# Patient Record
Sex: Male | Born: 1985 | Race: White | Hispanic: No | Marital: Single | Smoking: Never smoker
Health system: Southern US, Community
[De-identification: ages and names within clinical notes are randomized; demographics above are authoritative.]

## PROBLEM LIST (undated history)

## (undated) DIAGNOSIS — G8929 Other chronic pain: Secondary | ICD-10-CM

## (undated) DIAGNOSIS — G43909 Migraine, unspecified, not intractable, without status migrainosus: Secondary | ICD-10-CM

## (undated) DIAGNOSIS — M549 Dorsalgia, unspecified: Secondary | ICD-10-CM

## (undated) DIAGNOSIS — I10 Essential (primary) hypertension: Secondary | ICD-10-CM

## (undated) DIAGNOSIS — K259 Gastric ulcer, unspecified as acute or chronic, without hemorrhage or perforation: Secondary | ICD-10-CM

## (undated) DIAGNOSIS — Z9289 Personal history of other medical treatment: Secondary | ICD-10-CM

## (undated) HISTORY — PX: CHOLECYSTECTOMY: SHX55

## (undated) HISTORY — PX: APPENDECTOMY: SHX54

## (undated) HISTORY — PX: COLONOSCOPY: SHX174

---

## 2017-05-01 ENCOUNTER — Inpatient Hospital Stay (HOSPITAL_COMMUNITY)
Admission: EM | Admit: 2017-05-01 | Discharge: 2017-05-03 | DRG: 379 | Disposition: A | Discharge status: Home / Self Care | Payer: Self-pay | Attending: Internal Medicine | Admitting: Internal Medicine

## 2017-05-01 ENCOUNTER — Encounter (HOSPITAL_COMMUNITY): Payer: Self-pay | Admitting: Emergency Medicine

## 2017-05-01 ENCOUNTER — Emergency Department (HOSPITAL_COMMUNITY): Payer: Self-pay

## 2017-05-01 DIAGNOSIS — R778 Other specified abnormalities of plasma proteins: Secondary | ICD-10-CM | POA: Diagnosis present

## 2017-05-01 DIAGNOSIS — D509 Iron deficiency anemia, unspecified: Secondary | ICD-10-CM

## 2017-05-01 DIAGNOSIS — R739 Hyperglycemia, unspecified: Secondary | ICD-10-CM | POA: Diagnosis present

## 2017-05-01 DIAGNOSIS — F17290 Nicotine dependence, other tobacco product, uncomplicated: Secondary | ICD-10-CM | POA: Diagnosis present

## 2017-05-01 DIAGNOSIS — M549 Dorsalgia, unspecified: Secondary | ICD-10-CM | POA: Diagnosis present

## 2017-05-01 DIAGNOSIS — I10 Essential (primary) hypertension: Secondary | ICD-10-CM | POA: Diagnosis present

## 2017-05-01 DIAGNOSIS — D500 Iron deficiency anemia secondary to blood loss (chronic): Secondary | ICD-10-CM | POA: Diagnosis present

## 2017-05-01 DIAGNOSIS — K254 Chronic or unspecified gastric ulcer with hemorrhage: Principal | ICD-10-CM | POA: Diagnosis present

## 2017-05-01 DIAGNOSIS — R0600 Dyspnea, unspecified: Secondary | ICD-10-CM

## 2017-05-01 DIAGNOSIS — R7989 Other specified abnormal findings of blood chemistry: Secondary | ICD-10-CM

## 2017-05-01 DIAGNOSIS — G8929 Other chronic pain: Secondary | ICD-10-CM | POA: Diagnosis present

## 2017-05-01 DIAGNOSIS — K922 Gastrointestinal hemorrhage, unspecified: Secondary | ICD-10-CM | POA: Diagnosis present

## 2017-05-01 DIAGNOSIS — Z9049 Acquired absence of other specified parts of digestive tract: Secondary | ICD-10-CM

## 2017-05-01 DIAGNOSIS — T39395A Adverse effect of other nonsteroidal anti-inflammatory drugs [NSAID], initial encounter: Secondary | ICD-10-CM | POA: Diagnosis present

## 2017-05-01 DIAGNOSIS — R748 Abnormal levels of other serum enzymes: Secondary | ICD-10-CM | POA: Diagnosis present

## 2017-05-01 DIAGNOSIS — D649 Anemia, unspecified: Secondary | ICD-10-CM | POA: Diagnosis present

## 2017-05-01 HISTORY — DX: Gastric ulcer, unspecified as acute or chronic, without hemorrhage or perforation: K25.9

## 2017-05-01 HISTORY — DX: Dorsalgia, unspecified: M54.9

## 2017-05-01 HISTORY — DX: Migraine, unspecified, not intractable, without status migrainosus: G43.909

## 2017-05-01 HISTORY — DX: Essential (primary) hypertension: I10

## 2017-05-01 HISTORY — DX: Other chronic pain: G89.29

## 2017-05-01 LAB — BASIC METABOLIC PANEL
ANION GAP: 6 (ref 5–15)
BUN: 23 mg/dL — ABNORMAL HIGH (ref 6–20)
CALCIUM: 9.1 mg/dL (ref 8.9–10.3)
CHLORIDE: 106 mmol/L (ref 101–111)
CO2: 24 mmol/L (ref 22–32)
Creatinine, Ser: 1.18 mg/dL (ref 0.61–1.24)
GFR calc non Af Amer: >60 mL/min (ref >60)
Glucose, Bld: 118 mg/dL — ABNORMAL HIGH (ref 65–99)
Potassium: 4 mmol/L (ref 3.5–5.1)
Sodium: 136 mmol/L (ref 135–145)

## 2017-05-01 LAB — IRON AND TIBC
IRON: 12 ug/dL — AB (ref 45–182)
SATURATION RATIOS: 3 % — AB (ref 17.9–39.5)
TIBC: 384 ug/dL (ref 250–450)
UIBC: 372 ug/dL

## 2017-05-01 LAB — CBC WITH DIFFERENTIAL/PLATELET
BASOS ABS: 0 10*3/uL (ref 0.0–0.1)
BASOS PCT: 0 %
Eosinophils Absolute: 0.1 10*3/uL (ref 0.0–0.7)
Eosinophils Relative: 1 %
HEMATOCRIT: 24.2 % — AB (ref 39.0–52.0)
HEMOGLOBIN: 7.9 g/dL — AB (ref 13.0–17.0)
Lymphocytes Relative: 20 %
Lymphs Abs: 1.5 10*3/uL (ref 0.7–4.0)
MCH: 24.5 pg — ABNORMAL LOW (ref 26.0–34.0)
MCHC: 32.6 g/dL (ref 30.0–36.0)
MCV: 74.9 fL — ABNORMAL LOW (ref 78.0–100.0)
Monocytes Absolute: 0.4 10*3/uL (ref 0.1–1.0)
Monocytes Relative: 5 %
NEUTROS PCT: 74 %
Neutro Abs: 5.6 10*3/uL (ref 1.7–7.7)
Platelets: 358 10*3/uL (ref 150–400)
RBC: 3.23 MIL/uL — ABNORMAL LOW (ref 4.22–5.81)
RDW: 16.2 % — ABNORMAL HIGH (ref 11.5–15.5)
WBC: 7.5 10*3/uL (ref 4.0–10.5)

## 2017-05-01 LAB — ABO/RH: ABO/RH(D): B NEG

## 2017-05-01 LAB — VITAMIN B12: VITAMIN B 12: 184 pg/mL (ref 180–914)

## 2017-05-01 LAB — LACTATE DEHYDROGENASE: LDH: 129 U/L (ref 98–192)

## 2017-05-01 LAB — RETICULOCYTES
RBC.: 3.25 MIL/uL — ABNORMAL LOW (ref 4.22–5.81)
RETIC COUNT ABSOLUTE: 100.8 10*3/uL (ref 19.0–186.0)
Retic Ct Pct: 3.1 % (ref 0.4–3.1)

## 2017-05-01 LAB — PREPARE RBC (CROSSMATCH)

## 2017-05-01 LAB — POC OCCULT BLOOD, ED: FECAL OCCULT BLD: POSITIVE — AB

## 2017-05-01 LAB — FERRITIN: Ferritin: 6 ng/mL — ABNORMAL LOW (ref 24–336)

## 2017-05-01 LAB — TROPONIN I: Troponin I: 0.03 ng/mL (ref <0.03)

## 2017-05-01 LAB — FOLATE: FOLATE: 17.4 ng/mL (ref >5.9)

## 2017-05-01 MED ORDER — ACETAMINOPHEN 325 MG PO TABS
650.0000 mg | ORAL_TABLET | Freq: Four times a day (QID) | ORAL | Status: DC | PRN
  Administered 2017-05-01 – 2017-05-02 (×2): 650 mg via ORAL
  Filled 2017-05-01 (×2): qty 2

## 2017-05-01 MED ORDER — ACETAMINOPHEN 650 MG RE SUPP: 650.0000 mg | Freq: Four times a day (QID) | RECTAL | Status: DC | PRN

## 2017-05-01 MED ORDER — SODIUM CHLORIDE 0.9 % IV SOLN
Freq: Once | INTRAVENOUS | Status: AC
  Administered 2017-05-01: 17:00:00 via INTRAVENOUS

## 2017-05-01 MED ORDER — ONDANSETRON HCL 4 MG PO TABS: 4.0000 mg | ORAL_TABLET | Freq: Four times a day (QID) | ORAL | Status: DC | PRN

## 2017-05-01 MED ORDER — PANTOPRAZOLE SODIUM 40 MG IV SOLR
40.0000 mg | Freq: Two times a day (BID) | INTRAVENOUS | Status: DC
  Administered 2017-05-01 – 2017-05-02 (×2): 40 mg via INTRAVENOUS
  Filled 2017-05-01 (×2): qty 40

## 2017-05-01 MED ORDER — PANTOPRAZOLE SODIUM 40 MG IV SOLR
80.0000 mg | Freq: Once | INTRAVENOUS | Status: AC
  Administered 2017-05-01: 16:00:00 80 mg via INTRAVENOUS
  Filled 2017-05-01: qty 80

## 2017-05-01 MED ORDER — SODIUM CHLORIDE 0.9 % IV SOLN
8.0000 mg/h | INTRAVENOUS | Status: DC
  Administered 2017-05-01: 8 mg/h via INTRAVENOUS
  Filled 2017-05-01 (×3): qty 80

## 2017-05-01 MED ORDER — SODIUM CHLORIDE 0.9 % IV SOLN
INTRAVENOUS | Status: DC
  Administered 2017-05-01 – 2017-05-02 (×3): via INTRAVENOUS

## 2017-05-01 MED ORDER — BOOST / RESOURCE BREEZE PO LIQD
1.0000 | Freq: Three times a day (TID) | ORAL | Status: DC
Start: 2017-05-01 — End: 2017-05-03
  Administered 2017-05-01 – 2017-05-03 (×3): 1 via ORAL

## 2017-05-01 MED ORDER — PANTOPRAZOLE SODIUM 40 MG IV SOLR: 40.0000 mg | Freq: Two times a day (BID) | INTRAVENOUS | Status: DC

## 2017-05-01 MED ORDER — ONDANSETRON HCL 4 MG/2ML IJ SOLN: 4.0000 mg | Freq: Four times a day (QID) | INTRAMUSCULAR | Status: DC | PRN

## 2017-05-01 NOTE — H&P (Signed)
History and Physical    Alfred Avila ZOX:[id number] DOB: July 19, 1986 DOA: 05/01/2017  PCP: Patient, No Pcp Per Consultants:  None Patient coming from: Home - lives with roommates; NOK: [place], [phone]  Chief Complaint: SOB  HPI: Alfred Avila is a 31 y.o. male with medical history significant of GI bleed in 2017 requiring transfusion; migraines; and HTN presenting with SOB.  "Honestly, I thought it was a breathing problem.  Every time I stand up, I would get out of breath" x 2 days.  Started feeling tired at work 2 days ago and it has progressed.  No cough.  He tried to take Mucinex, take hot showers, did a breathing treatment (albuterol) - none of those things helped.  His friend commented that he looked pale and insisted he come to the hospital. No dark stool.  No indigestion.  No hematemesis.  No abdominal pain.  The last 3 weeks, he has taken  "a pretty massive amount" of NSAIDs - 8 Ibuprofen a day and 1 Goody's.  He reports that he pulled out his back at work and he was "eating these things" to get through the spasms and be able to work.  The back pain has finally started clearing up.  Denies ETOH - last use a little over a year now, none since his prior "ulcers".  +marijuana - last use 5 days ago, uses "not very often."  He vapes cannabinoid (CBD) oil.  He reports that he required a colonoscopy that required cauterization of 4 intestinal lesions that were bleeding prior - but he refers to these as "ulcers".  This was during hospitalization in [place] - he was transfused 6(?) units PRBC.   ED Course: Heme positive.  Likely iron deficient anemia from prolonged GI blood loss.  Transfusion since patient is symptomatic.  Started on Protonix drip.  Consider GI consult.  Review of Systems: As per HPI; otherwise review of systems reviewed and negative.   Ambulatory Status:  Ambulates without a cane or walker  Past Medical History:  Diagnosis Date  . Chronic back pain   .  Essential hypertension   . Gastric ulcer   . Migraines     Past Surgical History:  Procedure Laterality Date  . APPENDECTOMY    . CHOLECYSTECTOMY      Social History   Social History  . Marital status: Single    Spouse name: N/A  . Number of children: N/A  . Years of education: N/A   Occupational History  . Not on file.   Social History Main Topics  . Smoking status: Current Every Day Smoker    Types: E-cigarettes  . Smokeless tobacco: Never Used     Comment: cannabinoid oil (CBD oil) - 3-4 times daily  . Alcohol use Yes     Comment: denies use since prior "ulcer"  . Drug use: Yes    Types: Marijuana     Comment: marijuana 5 days ago  . Sexual activity: Not on file   Other Topics Concern  . 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    Not on File  History reviewed. No pertinent family history.  Prior to Admission medications   Not on File    Physical Exam: Vitals:   05/01/17 1456 05/01/17 1513 05/01/17 1619 05/01/17 1619  BP:  (!) 156/94 (!) 143/75  Pulse: 67 69  67  Resp: 16 18  17   Temp:    98.1 F (36.7 C)  TempSrc:    Oral  SpO2: 100% 94%  100%  Weight:      Height:         General: Appears calm and comfortable and is NAD Eyes:  PERRL, EOMI, normal lids, iris; pale conjunctivae ENT:  grossly normal hearing, lips & tongue, mmm Neck:  no LAD, masses or thyromegaly Cardiovascular:  RRR, no m/r/g. No LE edema.  Respiratory:  CTA bilaterally, no w/r/r. Normal respiratory effort. Abdomen:  soft, ntnd, NABS Skin:  no rash or induration seen on limited exam Musculoskeletal:  grossly normal tone BUE/BLE, good ROM, no bony abnormality Psychiatric:  grossly normal mood and affect, speech fluent and appropriate, AOx3 Neurologic:  CN 2-12 grossly intact, moves all extremities in coordinated fashion, sensation intact  Labs on Admission:  I have personally reviewed following labs and imaging studies  CBC:  Recent Labs Lab 05/01/17 1343  WBC 7.5  NEUTROABS 5.6  HGB 7.9*  HCT 24.2*  MCV 74.9*  PLT 358   Basic Metabolic Panel:  Recent Labs Lab 05/01/17 1343  NA 136  K 4.0  CL 106  CO2 24  GLUCOSE 118*  BUN 23*  CREATININE 1.18  CALCIUM 9.1   GFR: Estimated Creatinine Clearance: 118.3 mL/min (by C-G formula based on SCr of 1.18 mg/dL). Liver Function Tests: No results for input(s): AST, ALT, ALKPHOS, BILITOT, PROT, ALBUMIN in the last 168 hours. No results for input(s): LIPASE, AMYLASE in the last 168 hours. No results for input(s): AMMONIA in the last 168 hours. Coagulation Profile: No results for input(s): INR, PROTIME in the last 168 hours. Cardiac Enzymes:  Recent Labs Lab 05/01/17 1401  TROPONINI 0.03*   BNP (last 3 results) No results for input(s): PROBNP in the last 8760 hours. HbA1C: No results for input(s): HGBA1C in the last 72 hours. CBG: No results for input(s): GLUCAP in the last 168 hours. Lipid Profile: No results for input(s): CHOL, HDL, LDLCALC, TRIG, CHOLHDL, LDLDIRECT in the last 72 hours. Thyroid Function Tests: No results for input(s): TSH, T4TOTAL, FREET4, T3FREE, THYROIDAB in the last 72 hours. Anemia Panel:  Recent Labs  05/01/17 1401  RETICCTPCT 3.1   Urine analysis: No results found for: COLORURINE, APPEARANCEUR, LABSPEC, PHURINE, GLUCOSEU, HGBUR, BILIRUBINUR, KETONESUR, PROTEINUR, UROBILINOGEN, NITRITE, LEUKOCYTESUR  Creatinine Clearance: Estimated Creatinine Clearance: 118.3 mL/min (by C-G formula based on SCr of 1.18 mg/dL).  Sepsis Labs: @LABRCNTIP (procalcitonin:4,lacticidven:4) )No results found for this or any previous visit (from the past 240 hour(s)).   Radiological Exams on Admission: Dg Chest 2 View  Result Date: 05/01/2017 CLINICAL DATA:  Short of breath EXAM: CHEST  2 VIEW COMPARISON:  None. FINDINGS: The heart size and mediastinal contours are  within normal limits. Both lungs are clear. The visualized skeletal structures are unremarkable. IMPRESSION: No active cardiopulmonary disease. Electronically Signed   By: Marlan Palauharles  Clark M.D.   On: 05/01/2017 14:27    EKG: Independently reviewed.  NSR with rate 55; LVH with no evidence of acute ischemia  Assessment/Plan Principal Problem:   Symptomatic anemia Active Problems:   Upper GI bleed   Elevated troponin   Hyperglycemia   Essential hypertension   Symptomatic anemia -Hgb 7.9, MCV 74.9 - no prior labs available -MCV is <80 and so this is microcytic anemia -Since he is heme positive, GI bleeding is presumed to be the source of his anemia (see below) -Will observe in  med surg bed. -Given his age, apparent mild bleed (he has not noticed active bleeding and so this is more likely a slow leak than a particularly active bleed), and relatively stable current anemia, one could argue to hold a transfusion and follow the CBC for now. -However, transfusion of 2 units PRBC was initiated in the ER; will continue and recheck Hgb afterwards.  -Unless his bleeding becomes more brisk, he is not anticipated to need additional blood during this hospitalization. -If his SOB does not resolve after transfusion, that will require additional investigation.  Upper GI bleed -Patient with prior reported admission for GI bleed; the story on that bleed is contradictory in that he describes midepigastric abdominal pain and "ulcers" as the cause and states that he hd profuse hematochezia and needed 6 or 9 units PRBC but then reports no EGD and only a colonoscopy where he needed cautery of multiple lesions -Will request records from [place], since no additional information is available at this time. -In this circumstance, he admits to heavy recent NSAID use following an injury (now improved) but denies abdominal pain, burning, etc. -Stool per the EDP was soft and brown but very heme positive. -His BUN is mildly  elevated (23), possibly giving credence to an upper GI bleed -Will consult GI for possible EGD in AM. - Clear liquids today, NPO after MN for possible EGD - NS at 125 mL/hr - Will d/c Protonix drip (started in ER) and transition to IV Protonix BID since he is not actually having hematemesis - Zofran IV for nausea - Avoid NSAIDs and SQ heparin - Maintain IV access (2 large bore IVs if possible).  Elevated troponin -Troponin 0.03 -Likely due to very mild strain in the setting of anemia -Will trend -Very unlikely ACS  Hyperglycemia -Glucose 118 -May be stress response -Will follow with fasting AM labs for now  HTN -Reports taking metoprolol but he does not have any home medications listed -Consider restarting since patient appears to need HTN medication   DVT prophylaxis: Early ambulation Code Status:  Full - confirmed with patient Family Communication: None present Disposition Plan:  Home once clinically improved Consults called: GI  Admission status:  It is my clinical opinion that referral for OBSERVATION is reasonable and necessary in this patient based on the above information provided. The aforementioned taken together are felt to place the patient at high risk for further clinical deterioration. However it is anticipated that the patient may be medically stable for discharge from the hospital within 24 to 48 hours.     Jonah BlueJennifer Rashi Granier MD [facility]  If 7PM-7AM, please contact night-coverage [email or web address] Password [email or web address]  05/01/2017, 4:37 PM

## 2017-05-01 NOTE — ED Triage Notes (Signed)
Pt c/o sob with activity,chronic back pain. Pt color mildy pale/gray.  Denies blood in urine/stool. denis n/v/d. No cough.

## 2017-05-01 NOTE — ED Provider Notes (Signed)
[facility]-EMERGENCY DEPT Provider Note   CSN: [id number] Arrival date & time: 05/01/17  1321     History   Chief Complaint Chief Complaint  Patient presents with  . Shortness of Breath    HPI Alfred Avila is a 31 y.o. male.  HPI Patient is a 31 year old male who presents to emergency department increasing exertional shortness of breath over the past 2-3 days.  Family is noticed that these been pale.  He did require blood transfusion in 2017 for GI blood loss at that time.  He reports no melena or hematochezia.  Denies nausea vomiting.  Denies abdominal pain.  No chest pain or shortness breath at rest.  Denies orthopnea.  No prior history of congestive heart failure..  Denies cough.  No fevers or chills.  No use of anticoagulants   Past Medical History:  Diagnosis Date  . Chronic back pain   . Gastric ulcer     There are no active problems to display for this patient.   Past Surgical History:  Procedure Laterality Date  . APPENDECTOMY    . CHOLECYSTECTOMY         Home Medications    Prior to Admission medications   Not on File    Family History History reviewed. No pertinent family history.  Social History Social History  Substance Use Topics  . Smoking status: Never Smoker  . Smokeless tobacco: Never Used  . Alcohol use Yes     Comment: occ     Allergies   Patient has no allergy information on record.   Review of Systems Review of Systems  All other systems reviewed and are negative.    Physical Exam Updated Vital Signs BP (!) 156/94 (BP Location: Left Arm)   Pulse 69   Temp 98.2 F (36.8 C) (Oral)   Resp 18   Ht 6\' 6"  (1.981 m)   Wt 99.8 kg (220 lb)   SpO2 94%   BMI 25.42 kg/m   Physical Exam  Constitutional: He is oriented to person, place, and time. He appears well-developed and well-nourished.  HENT:  Head: Normocephalic and atraumatic.  Eyes: EOM are normal.  Pale conjunctivaPale conjunctiva  Neck: Normal range of motion.    Cardiovascular: Normal rate, regular rhythm and normal heart sounds.   Pulmonary/Chest: Effort normal and breath sounds normal. No respiratory distress.  Abdominal: Soft. He exhibits no distension. There is no tenderness.  Genitourinary:  Genitourinary Comments: Brown stool. No gross blood. hemoccult +  Musculoskeletal: Normal range of motion.  Neurological: He is alert and oriented to person, place, and time.  Skin: Skin is warm and dry.  Psychiatric: He has a normal mood and affect. Judgment normal.  Nursing note and vitals reviewed.    ED Treatments / Results  Labs (all labs ordered are listed, but only abnormal results are displayed) Labs Reviewed  CBC WITH DIFFERENTIAL/PLATELET - Abnormal; Notable for the following:       Result Value   RBC 3.23 (*)    Hemoglobin 7.9 (*)    HCT 24.2 (*)    MCV 74.9 (*)    MCH 24.5 (*)    RDW 16.2 (*)    All other components within normal limits  BASIC METABOLIC PANEL - Abnormal; Notable for the following:    Glucose, Bld 118 (*)    BUN 23 (*)    All other components within normal limits  RETICULOCYTES - Abnormal; Notable for the following:    RBC. 3.25 (*)  All other components within normal limits  TROPONIN I - Abnormal; Notable for the following:    Troponin I 0.03 (*)    All other components within normal limits  POC OCCULT BLOOD, ED - Abnormal; Notable for the following:    Fecal Occult Bld POSITIVE (*)    All other components within normal limits  LACTATE DEHYDROGENASE  VITAMIN B12  FOLATE  IRON AND TIBC  FERRITIN  TYPE AND SCREEN  PREPARE RBC (CROSSMATCH)    EKG  EKG Interpretation  Date/Time:  Tuesday May 01 2017 13:41:29 EDT Ventricular Rate:  55 PR Interval:    QRS Duration: 117 QT Interval:  476 QTC Calculation: 456 R Axis:   63 Text Interpretation:  Sinus rhythm LVH with IVCD and secondary repol abnrm Anterior ST elevation, probably due to LVH No old tracing to compare Confirmed by Azalia Bilisampos, Icker Swigert ([id number])  on 05/01/2017 3:44:30 PM       Radiology Dg Chest 2 View  Result Date: 05/01/2017 CLINICAL DATA:  Short of breath EXAM: CHEST  2 VIEW COMPARISON:  None. FINDINGS: The heart size and mediastinal contours are within normal limits. Both lungs are clear. The visualized skeletal structures are unremarkable. IMPRESSION: No active cardiopulmonary disease. Electronically Signed   By: Marlan Palauharles  Clark M.D.   On: 05/01/2017 14:27    Procedures .Critical Care Performed by: Azalia BilisAMPOS, Aquarius Tremper Authorized by: Azalia BilisAMPOS, Jenina Moening     Total critical care time: 35 minutes Critical care time was exclusive of separately billable procedures and treating other patients. Critical care was necessary to treat or prevent imminent or life-threatening deterioration. Critical care was time spent personally by me on the following activities: development of treatment plan with patient and/or surrogate as well as nursing, discussions with consultants, evaluation of patient's response to treatment, examination of patient, obtaining history from patient or surrogate, ordering and performing treatments and interventions, ordering and review of laboratory studies, ordering and review of radiographic studies, pulse oximetry and re-evaluation of patient's condition.   Medications Ordered in ED Medications  pantoprazole (PROTONIX) 80 mg in sodium chloride 0.9 % 100 mL IVPB (not administered)  pantoprazole (PROTONIX) 80 mg in sodium chloride 0.9 % 250 mL (0.32 mg/mL) infusion (not administered)  pantoprazole (PROTONIX) injection 40 mg (not administered)  0.9 %  sodium chloride infusion (not administered)     Initial Impression / Assessment and Plan / ED Course  I have reviewed the triage vital signs and the nursing notes.  Pertinent labs & imaging results that were available during my care of the patient were reviewed by me and considered in my medical decision making (see chart for details).     Hemoccult positive.  Likely iron  deficient anemia given microcytic anemia and likely prolonged GI blood loss.  Blood transfusion now as he is symptomatic.  Patient be admitted the hospital.  Initiated on IV Protonix with Protonix drip.  May benefit from endoscopy while in the hospital  Final Clinical Impressions(s) / ED Diagnoses   Final diagnoses:  Microcytic anemia  Dyspnea, unspecified type    New Prescriptions New Prescriptions   No medications on file     Azalia Bilisampos, Shakedra Beam, MD 05/01/17 1545

## 2017-05-02 ENCOUNTER — Encounter (HOSPITAL_COMMUNITY)
Admission: EM | Disposition: A | Discharge status: Home / Self Care | Payer: Self-pay | Source: Home / Self Care | Attending: Internal Medicine

## 2017-05-02 ENCOUNTER — Encounter (HOSPITAL_COMMUNITY): Payer: Self-pay | Admitting: Anesthesiology

## 2017-05-02 ENCOUNTER — Encounter (HOSPITAL_COMMUNITY): Payer: Self-pay | Admitting: *Deleted

## 2017-05-02 DIAGNOSIS — R195 Other fecal abnormalities: Secondary | ICD-10-CM

## 2017-05-02 DIAGNOSIS — D500 Iron deficiency anemia secondary to blood loss (chronic): Secondary | ICD-10-CM

## 2017-05-02 DIAGNOSIS — K3189 Other diseases of stomach and duodenum: Secondary | ICD-10-CM

## 2017-05-02 DIAGNOSIS — K259 Gastric ulcer, unspecified as acute or chronic, without hemorrhage or perforation: Secondary | ICD-10-CM

## 2017-05-02 HISTORY — PX: ESOPHAGOGASTRODUODENOSCOPY: SHX5428

## 2017-05-02 LAB — TYPE AND SCREEN
ABO/RH(D): B NEG
Antibody Screen: NEGATIVE
UNIT DIVISION: 0
Unit division: 0

## 2017-05-02 LAB — BPAM RBC
Blood Product Expiration Date: 201808202359
Blood Product Expiration Date: 201808302359
ISSUE DATE / TIME: 201807311611
ISSUE DATE / TIME: 201807311808
UNIT TYPE AND RH: 1700
Unit Type and Rh: 1700

## 2017-05-02 LAB — BASIC METABOLIC PANEL
ANION GAP: 5 (ref 5–15)
BUN: 16 mg/dL (ref 6–20)
CALCIUM: 8.6 mg/dL — AB (ref 8.9–10.3)
CO2: 25 mmol/L (ref 22–32)
Chloride: 109 mmol/L (ref 101–111)
Creatinine, Ser: 1.12 mg/dL (ref 0.61–1.24)
GLUCOSE: 97 mg/dL (ref 65–99)
Potassium: 4.1 mmol/L (ref 3.5–5.1)
SODIUM: 139 mmol/L (ref 135–145)

## 2017-05-02 LAB — HEMOGLOBIN AND HEMATOCRIT, BLOOD
HCT: 27.1 % — ABNORMAL LOW (ref 39.0–52.0)
HEMOGLOBIN: 8.8 g/dL — AB (ref 13.0–17.0)

## 2017-05-02 LAB — RAPID URINE DRUG SCREEN, HOSP PERFORMED
Amphetamines: POSITIVE — AB
Barbiturates: NOT DETECTED
Benzodiazepines: NOT DETECTED
COCAINE: NOT DETECTED
OPIATES: NOT DETECTED
Tetrahydrocannabinol: NOT DETECTED

## 2017-05-02 SURGERY — ESOPHAGOGASTRODUODENOSCOPY (EGD) WITH PROPOFOL
Anesthesia: Monitor Anesthesia Care

## 2017-05-02 SURGERY — EGD (ESOPHAGOGASTRODUODENOSCOPY)
Anesthesia: Moderate Sedation

## 2017-05-02 MED ORDER — LIDOCAINE VISCOUS 2 % MT SOLN
OROMUCOSAL | Status: AC
  Filled 2017-05-02: qty 15

## 2017-05-02 MED ORDER — PROPOFOL 10 MG/ML IV BOLUS
INTRAVENOUS | Status: AC
  Filled 2017-05-02: qty 40

## 2017-05-02 MED ORDER — LIDOCAINE HCL (PF) 1 % IJ SOLN
INTRAMUSCULAR | Status: AC
  Filled 2017-05-02: qty 5

## 2017-05-02 MED ORDER — MEPERIDINE HCL 50 MG/ML IJ SOLN
INTRAMUSCULAR | Status: AC
  Filled 2017-05-02: qty 1

## 2017-05-02 MED ORDER — MIDAZOLAM HCL 5 MG/5ML IJ SOLN
INTRAMUSCULAR | Status: DC | PRN
  Administered 2017-05-02 (×2): 2 mg via INTRAVENOUS

## 2017-05-02 MED ORDER — PROMETHAZINE HCL 25 MG/ML IJ SOLN
INTRAMUSCULAR | Status: AC
  Administered 2017-05-02: 25 mg
  Filled 2017-05-02: qty 1

## 2017-05-02 MED ORDER — PANTOPRAZOLE SODIUM 40 MG PO TBEC
40.0000 mg | DELAYED_RELEASE_TABLET | Freq: Two times a day (BID) | ORAL | Status: DC
Start: 2017-05-02 — End: 2017-05-03
  Administered 2017-05-02 – 2017-05-03 (×2): 40 mg via ORAL
  Filled 2017-05-02 (×3): qty 1

## 2017-05-02 MED ORDER — MIDAZOLAM HCL 5 MG/5ML IJ SOLN
INTRAMUSCULAR | Status: AC
  Filled 2017-05-02: qty 10

## 2017-05-02 MED ORDER — LIDOCAINE VISCOUS 2 % MT SOLN
OROMUCOSAL | Status: DC | PRN
  Administered 2017-05-02: 1 via OROMUCOSAL

## 2017-05-02 MED ORDER — MEPERIDINE HCL 50 MG/ML IJ SOLN
INTRAMUSCULAR | Status: DC | PRN
  Administered 2017-05-02 (×2): 25 mg via INTRAVENOUS

## 2017-05-02 MED ORDER — FENTANYL CITRATE (PF) 100 MCG/2ML IJ SOLN
INTRAMUSCULAR | Status: AC
  Filled 2017-05-02: qty 2

## 2017-05-02 MED ORDER — SODIUM CHLORIDE 0.9% FLUSH
INTRAVENOUS | Status: AC
  Filled 2017-05-02: qty 10

## 2017-05-02 MED ORDER — SODIUM CHLORIDE 0.9 % IV SOLN
INTRAVENOUS | Status: DC
  Administered 2017-05-02: 1000 mL via INTRAVENOUS

## 2017-05-02 MED ORDER — MIDAZOLAM HCL 2 MG/2ML IJ SOLN
INTRAMUSCULAR | Status: AC
  Filled 2017-05-02: qty 2

## 2017-05-02 MED ORDER — FERROUS SULFATE 325 (65 FE) MG PO TABS
325.0000 mg | ORAL_TABLET | Freq: Two times a day (BID) | ORAL | Status: DC
  Administered 2017-05-02 – 2017-05-03 (×2): 325 mg via ORAL
  Filled 2017-05-02 (×3): qty 1

## 2017-05-02 NOTE — Op Note (Signed)
[facility] Patient Name: Alfred Avila Procedure Date: 05/02/2017 4:27 PM MRN: [id number] Date of Birth: 1986/08/14 Attending MD: Lionel December , MD CSN: [id number] Age: 31 Admit Type: Inpatient Procedure:                Upper GI endoscopy Indications:              Iron deficiency anemia secondary to chronic blood                            loss, Heme positive stool Providers:                Lionel December, MD, Jannett Celestine, RN, Nena Polio, RN Referring MD:             Houston Siren, MD Medicines:                Promethazine 25 mg IV, Lidocaine spray, Meperidine                            50 mg IV, Midazolam 5 mg IV Complications:            No immediate complications. Estimated Blood Loss:     Estimated blood loss: none. Procedure:                Pre-Anesthesia Assessment:                           - Prior to the procedure, a History and Physical                            was performed, and patient medications and                            allergies were reviewed. The patient's tolerance of                            previous anesthesia was also reviewed. The risks                            and benefits of the procedure and the sedation                            options and risks were discussed with the patient.                            All questions were answered, and informed consent                            was obtained. Prior Anticoagulants: The patient                            last took aspirin 3 days and ibuprofen 3 days prior                            to the procedure. ASA Grade Assessment: I - A  normal, healthy patient. After reviewing the risks                            and benefits, the patient was deemed in                            satisfactory condition to undergo the procedure.                           After obtaining informed consent, the endoscope was                            passed under direct vision. Throughout the                     procedure, the patient's blood pressure, pulse, and                            oxygen saturations were monitored continuously. The                            [id number] ([id number]) scope was introduced through the                            mouth, and advanced to the second part of duodenum.                            The upper GI endoscopy was accomplished without                            difficulty. The patient tolerated the procedure                            well. Scope In: 4:45:46 PM Scope Out: 4:52:58 PM Total Procedure Duration: 0 hours 7 minutes 12 seconds  Findings:      The examined esophagus was normal.      The Z-line was regular and was found 44 cm from the incisors.      A small amount of food (residue) was found in the gastric body.      A few erosions were found in the gastric antrum. There were no stigmata       of recent bleeding.      Two non-bleeding cratered and superficial gastric ulcers with no       stigmata of bleeding were found in the prepyloric region of the stomach.       The largest lesion was 3 mm in largest dimension.      The exam of the stomach was otherwise normal.      The duodenal bulb and second portion of the duodenum were normal. Impression:               - Normal esophagus.                           - Z-line regular, 44 cm from the incisors.                           -  A small amount of food (residue) in the stomach.                           - Erosive gastropathy.                           - Non-bleeding gastric ulcers with no stigmata of                            bleeding.                           - Normal duodenal bulb and second portion of the                            duodenum.                           - No specimens collected.                           comment: No stigmata of bleeding noted from gastric                            ulcer. He could've bled from these lesions few days                            ago or he  could've bled from small or large                            bowel(NSAID-induced injury to small or large bowel).                           No further workup unless bleeding recurs or he does                            not respond to by mouth iron. Moderate Sedation:      Moderate (conscious) sedation was administered by the endoscopy nurse       and supervised by the endoscopist. The following parameters were       monitored: oxygen saturation, heart rate, blood pressure, CO2       capnography and response to care. Total physician intraservice time was       14 minutes. Recommendation:           - Return patient to hospital ward for ongoing care.                           - Low sodium diet today.                           - Continue present medications.                           - H. pylori serology.                           -  Change PPI to oral route.                           - Begin ferrous sulfate 325 mg by mouth twice a day.                           - No NSAIDs. Procedure Code(s):        --- Professional ---                           [phone], Esophagogastroduodenoscopy, flexible,                            transoral; diagnostic, including collection of                            specimen(s) by brushing or washing, when performed                            (separate procedure)                           99152, Moderate sedation services provided by the                            same physician or other qualified health care                            professional performing the diagnostic or                            therapeutic service that the sedation supports,                            requiring the presence of an independent trained                            observer to assist in the monitoring of the                            patient's level of consciousness and physiological                            status; initial 15 minutes of intraservice time,                             patient age 90 years or older Diagnosis Code(s):        --- Professional ---                           K31.89, Other diseases of stomach and duodenum                           K25.9, Gastric ulcer, unspecified as acute or  chronic, without hemorrhage or perforation                           D50.0, Iron deficiency anemia secondary to blood                            loss (chronic)                           R19.5, Other fecal abnormalities CPT copyright 2016 [employer]. All rights reserved. The codes documented in this report are preliminary and upon coder review may  be revised to meet current compliance requirements. Lionel DecemberNajeeb Rehman, MD Lionel DecemberNajeeb Rehman, MD 05/02/2017 5:13:35 PM This report has been signed electronically. Number of Addenda: 0

## 2017-05-02 NOTE — Consult Note (Signed)
Reason for Consult:GI bleed Referring Physician:   [name] is an 31 y.o. male.  HPI: Admitted thru the ED yesterday. States he felt weak. Hx of ulcer per patient. Admitted to [facility] in 2017. States he had a colonoscopy and he says he had 4 ulcers. He denies any abdominal pain or heart burn at this time. His stools are brown in color. States he has been taking 8 Ibuprofen a day and Goody Powder's x 1 a day.  No Goody Powders or Ibuprofen  in 3 days for back pain. No weight loss. Appetite is okay. BMs x 1 a day. No melena.  On admission his hemoglobin was 7.9. Since admission he has received 2 units of PRBCs. Stool was brown and guaiac positive in the ED.  No PCP.   Past Medical History:  Diagnosis Date  . Chronic back pain   . Essential hypertension   . Gastric ulcer   . Migraines     Past Surgical History:  Procedure Laterality Date  . APPENDECTOMY    . CHOLECYSTECTOMY      History reviewed. No pertinent family history.  Social History:  reports that he has been smoking E-cigarettes.  He has never used smokeless tobacco. He reports that he drinks alcohol. He reports that he uses drugs, including Marijuana.  Allergies: No Known Allergies  Medications: I have reviewed the patient's current medications.  Results for orders placed or performed during the hospital encounter of 05/01/17 (from the past 48 hour(s))  CBC with Differential     Status: Abnormal   Collection Time: 05/01/17  1:43 PM  Result Value Ref Range   WBC 7.5 4.0 - 10.5 K/uL   RBC 3.23 (L) 4.22 - 5.81 MIL/uL   Hemoglobin 7.9 (L) 13.0 - 17.0 g/dL   HCT 24.2 (L) 39.0 - 52.0 %   MCV 74.9 (L) 78.0 - 100.0 fL   MCH 24.5 (L) 26.0 - 34.0 pg   MCHC 32.6 30.0 - 36.0 g/dL   RDW 16.2 (H) 11.5 - 15.5 %   Platelets 358 150 - 400 K/uL   Neutrophils Relative % 74 %   Neutro Abs 5.6 1.7 - 7.7 K/uL   Lymphocytes Relative 20 %   Lymphs Abs 1.5 0.7 - 4.0 K/uL   Monocytes Relative 5 %   Monocytes Absolute  0.4 0.1 - 1.0 K/uL   Eosinophils Relative 1 %   Eosinophils Absolute 0.1 0.0 - 0.7 K/uL   Basophils Relative 0 %   Basophils Absolute 0.0 0.0 - 0.1 K/uL  Basic metabolic panel     Status: Abnormal   Collection Time: 05/01/17  1:43 PM  Result Value Ref Range   Sodium 136 135 - 145 mmol/L   Potassium 4.0 3.5 - 5.1 mmol/L   Chloride 106 101 - 111 mmol/L   CO2 24 22 - 32 mmol/L   Glucose, Bld 118 (H) 65 - 99 mg/dL   BUN 23 (H) 6 - 20 mg/dL   Creatinine, Ser 1.18 0.61 - 1.24 mg/dL   Calcium 9.1 8.9 - 10.3 mg/dL   GFR calc non Af Amer >60 >60 mL/min   GFR calc Af Amer >60 >60 mL/min    Comment: (NOTE) The eGFR has been calculated using the CKD EPI equation. This calculation has not been validated in all clinical situations. eGFR's persistently <60 mL/min signify possible Chronic Kidney Disease.    Anion gap 6 5 - 15  Type and screen [facility]     Status:  None   Collection Time: 05/01/17  1:43 PM  Result Value Ref Range   ABO/RH(D) B NEG    Antibody Screen NEG    Sample Expiration 05/04/2017    Unit Number [id number]    Blood Component Type RED CELLS,LR    Unit division 00    Status of Unit ISSUED,FINAL    Transfusion Status OK TO TRANSFUSE    Crossmatch Result Compatible    Unit Number [id number]    Blood Component Type RED CELLS,LR    Unit division 00    Status of Unit ISSUED,FINAL    Transfusion Status OK TO TRANSFUSE    Crossmatch Result Compatible   ABO/Rh     Status: None   Collection Time: 05/01/17  1:43 PM  Result Value Ref Range   ABO/RH(D) B NEG   Vitamin B12     Status: None   Collection Time: 05/01/17  2:01 PM  Result Value Ref Range   Vitamin B-12 184 180 - 914 pg/mL    Comment: (NOTE) This assay is not validated for testing neonatal or myeloproliferative syndrome specimens for Vitamin B12 levels. Performed at [facility], [place] [address], [place], [place] [address]   Iron and TIBC     Status: Abnormal   Collection Time: 05/01/17   2:01 PM  Result Value Ref Range   Iron 12 (L) 45 - 182 ug/dL   TIBC 384 250 - 450 ug/dL   Saturation Ratios 3 (L) 17.9 - 39.5 %   UIBC 372 ug/dL    Comment: Performed at [facility], [place] [address], [place], [place] [address]  Ferritin     Status: Abnormal   Collection Time: 05/01/17  2:01 PM  Result Value Ref Range   Ferritin 6 (L) 24 - 336 ng/mL    Comment: Performed at [facility], [place] [address] [place], [place] [address]  Reticulocytes     Status: Abnormal   Collection Time: 05/01/17  2:01 PM  Result Value Ref Range   Retic Ct Pct 3.1 0.4 - 3.1 %   RBC. 3.25 (L) 4.22 - 5.81 MIL/uL   Retic Count, Absolute 100.8 19.0 - 186.0 K/uL  Lactate dehydrogenase     Status: None   Collection Time: 05/01/17  2:01 PM  Result Value Ref Range   LDH 129 98 - 192 U/L  Troponin I     Status: Abnormal   Collection Time: 05/01/17  2:01 PM  Result Value Ref Range   Troponin I 0.03 (HH) <0.03 ng/mL    Comment: CRITICAL RESULT CALLED TO, READ BACK BY AND VERIFIED WITH: Monacelli,C ON 05/01/17 AT 1455 BY LOY,C   Folate     Status: None   Collection Time: 05/01/17  2:15 PM  Result Value Ref Range   Folate 17.4 >5.9 ng/mL    Comment: Performed at [facility], [place] [address] [place], [place] [address]  POC occult blood, ED     Status: Abnormal   Collection Time: 05/01/17  3:36 PM  Result Value Ref Range   Fecal Occult Bld POSITIVE (A) NEGATIVE  Prepare RBC     Status: None   Collection Time: 05/01/17  4:00 PM  Result Value Ref Range   Order Confirmation ORDER PROCESSED BY BLOOD BANK   Basic metabolic panel     Status: Abnormal   Collection Time: 05/02/17  6:25 AM  Result Value Ref Range   Sodium 139 135 - 145 mmol/L  Potassium 4.1 3.5 - 5.1 mmol/L   Chloride 109 101 - 111 mmol/L   CO2 25 22 - 32 mmol/L   Glucose, Bld 97 65 - 99 mg/dL   BUN 16 6 - 20 mg/dL   Creatinine, Ser 1.12 0.61 - 1.24 mg/dL   Calcium 8.6 (L) 8.9 - 10.3 mg/dL   GFR calc non Af Amer >60 >60  mL/min   GFR calc Af Amer >60 >60 mL/min    Comment: (NOTE) The eGFR has been calculated using the CKD EPI equation. This calculation has not been validated in all clinical situations. eGFR's persistently <60 mL/min signify possible Chronic Kidney Disease.    Anion gap 5 5 - 15    Dg Chest 2 View  Result Date: 05/01/2017 CLINICAL DATA:  Short of breath EXAM: CHEST  2 VIEW COMPARISON:  None. FINDINGS: The heart size and mediastinal contours are within normal limits. Both lungs are clear. The visualized skeletal structures are unremarkable. IMPRESSION: No active cardiopulmonary disease. Electronically Signed   By: Franchot Gallo M.D.   On: 05/01/2017 14:27    ROS Blood pressure (!) 161/100, pulse 75, temperature 97.8 F (36.6 C), temperature source Oral, resp. rate 18, height 6' 6"  (1.981 m), weight 220 lb (99.8 kg), SpO2 100 %. Physical Exam Alert and oriented. Skin warm and dry. Oral mucosa is moist.   . Sclera anicteric, conjunctivae is pink. Thyroid not enlarged. No cervical lymphadenopathy. Lungs clear. Heart regular rate and rhythm.  Abdomen is soft. Bowel sounds are positive. No hepatomegaly. No abdominal masses felt. No tenderness.  No edema to lower extremities.    Assessment/Plan: Symptomatic anemia. Probably GI bleed. Agree with Protonix. I will discuss with Dr. Laural Golden.  Will get records from [facility] (colonoscopy). Clear liquids this am and then NPO.   SETZER,TERRI W 05/02/2017, 7:47 AM

## 2017-05-02 NOTE — Progress Notes (Signed)
Initial Nutrition Assessment  DOCUMENTATION CODES:   Not applicable  INTERVENTION:  Boost Breeze po TID, each supplement provides 250 kcal and 9 grams of protein   Advance diet as pt is medically cleared by GI   NUTRITION DIAGNOSIS:   Altered nutrition lab value related to altered GI function as evidenced by  (Symtomatic anemia).   GOAL:   Patient will meet greater than or equal to 90% of their needs   MONITOR:   Diet advancement, Labs, Weight trends  REASON FOR ASSESSMENT:   Malnutrition Screening Tool    ASSESSMENT: Mr Randa Evensdwards has a hx of GIB, HTN, Back pain. He presents with c/o shortness of breath and was found to be anemic. Heme positive stools. EGD is pending.  During RD visit he is complaining of being hungry. His has been advanced to clear liquid (100%) consumed. His home diet is regular and pt says he has a garden which has done very well this year. A few years ago after gall bladder surgery- he began eating more fruits and vegetables and has decreased the amount of fried foods he eats. He bakes instead of frying and tries to eat 5-6 small meals daily. Lately he has been drinking Boost to supplement intake. He also takes a fish oil and MVI at home. Nutrition-Focused physical exam: well nourished.   His weight has decreased over the past few years per patient but expect this is directly associated with his diet changes and considered desirable.     Recent Labs Lab 05/01/17 1343 05/02/17 0625  NA 136 139  K 4.0 4.1  CL 106 109  CO2 24 25  BUN 23* 16  CREATININE 1.18 1.12  CALCIUM 9.1 8.6*  GLUCOSE 118* 97   labs and meds reviewed.  Diet Order:  Diet clear liquid Room service appropriate? Yes; Fluid consistency: Thin  Skin:  Reviewed, no issues  Last BM:  7/31  Height:   Ht Readings from Last 1 Encounters:  05/01/17 6\' 6"  (1.981 m)    Weight:   Wt Readings from Last 1 Encounters:  05/01/17 220 lb (99.8 kg)    Ideal Body Weight:  97 kg  BMI:   Body mass index is 25.42 kg/m.  Estimated Nutritional Needs:   Kcal:  2200-2300 kcal  Protein:  110 gr   Fluid:  2.2-2.3 liters daily  EDUCATION NEEDS: none identified at this time    Royann ShiversLynn Jeremy Mclamb MS,RD,CSG,LDN Office: #[phone] Pager: [phone]

## 2017-05-02 NOTE — Progress Notes (Signed)
PROGRESS NOTE    Alfred LeydenDustin Avila  WUJ:[id number] DOB: 08/19/1986 DOA: 05/01/2017 PCP: Patient, No Pcp Per    Brief Narrative: 31 yo admitted for UGI bleed, felt to be due to NSAIDS.  He had EGD which showed clean based ulcer x 2 in the stomach. He could be bleeding from small bowels.  Dr Renae Fickleheman recommended avoiding ASA and NSAIDS.  He will continue with PPI, and obtain another H and H in the morning before he can be discharged.    Assessment & Plan:   Principal Problem:   Symptomatic anemia Active Problems:   Upper GI bleed   Elevated troponin   Hyperglycemia   Essential hypertension   1. UGIB:  From NSAIDS use.  Will check another H and H in the morning.  If stable, Dr Renae Fickleheman of GI felt that he can be discharged tomorrow morning. 2.        HTN:  BB was held in the setting of severe bleeding causing anemia. 3.        Elevation of troponin:  Modest.  Clinically not significant.    DVT prophylaxis: SCD.  Code Status: FULL CODE.  Family Communication: None.  Disposition Plan: Home.   Consultants:   GI.   Procedures:   EGD  Antimicrobials: Anti-infectives    None       Subjective:  Wanted to go home.   Objective: Vitals:   05/02/17 1650 05/02/17 1655 05/02/17 1700 05/02/17 1705  BP: (!) 180/94 (!) 156/92 (!) 167/74 (!) 163/81  Pulse: 82 77 78 78  Resp: 14 13 11 11   Temp:      TempSrc:      SpO2: 100% 100% 100% 100%  Weight:      Height:        Intake/Output Summary (Last 24 hours) at 05/02/17 1711 Last data filed at 05/02/17 1630  Gross per 24 hour  Intake              570 ml  Output             1150 ml  Net             -580 ml   Filed Weights   05/01/17 1326  Weight: 99.8 kg (220 lb)    Examination:  General exam: Appears calm and comfortable  Respiratory system: Clear to auscultation. Respiratory effort normal. Cardiovascular system: S1 & S2 heard, RRR. No JVD, murmurs, rubs, gallops or clicks. No pedal edema. Gastrointestinal system: Abdomen  is nondistended, soft and nontender. No organomegaly or masses felt. Normal bowel sounds heard. Central nervous system: Alert and oriented. No focal neurological deficits. Extremities: Symmetric 5 x 5 power. Skin: No rashes, lesions or ulcers Psychiatry: Judgement and insight appear normal. Mood & affect appropriate.   Data Reviewed: I have personally reviewed following labs and imaging studies  CBC:  Recent Labs Lab 05/01/17 1343 05/02/17 0625  WBC 7.5  --   NEUTROABS 5.6  --   HGB 7.9* 8.8*  HCT 24.2* 27.1*  MCV 74.9*  --   PLT 358  --    Basic Metabolic Panel:  Recent Labs Lab 05/01/17 1343 05/02/17 0625  NA 136 139  K 4.0 4.1  CL 106 109  CO2 24 25  GLUCOSE 118* 97  BUN 23* 16  CREATININE 1.18 1.12  CALCIUM 9.1 8.6*    Recent Labs Lab 05/01/17 1401  TROPONINI 0.03*   Anemia Panel:  Recent Labs  05/01/17 1401 05/01/17 1415  VITAMINB12  184  --   FOLATE  --  17.4  FERRITIN 6*  --   TIBC 384  --   IRON 12*  --   RETICCTPCT 3.1  --    Radiology Studies: Dg Chest 2 View  Result Date: 05/01/2017 CLINICAL DATA:  Short of breath EXAM: CHEST  2 VIEW COMPARISON:  None. FINDINGS: The heart size and mediastinal contours are within normal limits. Both lungs are clear. The visualized skeletal structures are unremarkable. IMPRESSION: No active cardiopulmonary disease. Electronically Signed   By: Marlan Palauharles  Clark M.D.   On: 05/01/2017 14:27    Scheduled Meds: . lidocaine      . meperidine      . midazolam      . [MAR Hold] feeding supplement  1 Container Oral TID BM  . ferrous sulfate  325 mg Oral BID WC  . pantoprazole  40 mg Oral BID AC   Continuous Infusions: . sodium chloride 125 mL/hr at 05/02/17 0701  . sodium chloride 1,000 mL (05/02/17 1516)     LOS: 0 days   Clemens Lachman, MD FACP Hospitalist.   If 7PM-7AM, please contact night-coverage [email or web address] Password [email or web address] 05/02/2017, 5:11 PM

## 2017-05-03 ENCOUNTER — Encounter (HOSPITAL_COMMUNITY): Payer: Self-pay | Admitting: Internal Medicine

## 2017-05-03 DIAGNOSIS — I10 Essential (primary) hypertension: Secondary | ICD-10-CM

## 2017-05-03 DIAGNOSIS — D649 Anemia, unspecified: Secondary | ICD-10-CM

## 2017-05-03 DIAGNOSIS — R748 Abnormal levels of other serum enzymes: Secondary | ICD-10-CM

## 2017-05-03 DIAGNOSIS — R0600 Dyspnea, unspecified: Secondary | ICD-10-CM

## 2017-05-03 LAB — CBC
HCT: 30.1 % — ABNORMAL LOW (ref 39.0–52.0)
Hemoglobin: 9.6 g/dL — ABNORMAL LOW (ref 13.0–17.0)
MCH: 25.2 pg — ABNORMAL LOW (ref 26.0–34.0)
MCHC: 31.9 g/dL (ref 30.0–36.0)
MCV: 79 fL (ref 78.0–100.0)
PLATELETS: 344 10*3/uL (ref 150–400)
RBC: 3.81 MIL/uL — ABNORMAL LOW (ref 4.22–5.81)
RDW: 18.1 % — AB (ref 11.5–15.5)
WBC: 6.9 10*3/uL (ref 4.0–10.5)

## 2017-05-03 LAB — HIV ANTIBODY (ROUTINE TESTING W REFLEX): HIV Screen 4th Generation wRfx: NONREACTIVE

## 2017-05-03 MED ORDER — TEMAZEPAM 15 MG PO CAPS
15.0000 mg | ORAL_CAPSULE | Freq: Every evening | ORAL | Status: DC | PRN
  Administered 2017-05-03: 15 mg via ORAL
  Filled 2017-05-03: qty 1

## 2017-05-03 MED ORDER — PANTOPRAZOLE SODIUM 40 MG PO TBEC: 40.0000 mg | DELAYED_RELEASE_TABLET | Freq: Two times a day (BID) | ORAL | 1 refills | Status: DC

## 2017-05-03 MED ORDER — HYDRALAZINE HCL 20 MG/ML IJ SOLN
20.0000 mg | INTRAMUSCULAR | Status: DC | PRN
  Administered 2017-05-03: 20 mg via INTRAVENOUS
  Filled 2017-05-03: qty 1

## 2017-05-03 MED ORDER — FERROUS SULFATE 325 (65 FE) MG PO TABS: 325.0000 mg | ORAL_TABLET | Freq: Two times a day (BID) | ORAL | 1 refills | Status: DC

## 2017-05-03 NOTE — Treatment Plan (Signed)
The nursing staff reported that blood pressure 176/150 mmHg. He is otherwise in no acute distress. He has a history of hypertension and is on oral metoprolol at home. The patient's chart was reviewed. He had EGD done earlier in the day. Urine toxicology shows positivity to amphetamine, so beta blockers are contraindicated at this time. The patient's weight is about 100 kg. I have ordered hydralazine 20 mg IVP every 6 hours for SBP over 150 mmHg. Temazepam 15 mg by mouth daily at bedtime when necessary ordered for insomnia.  Sanda Kleinavid Ortiz, M.D.

## 2017-05-03 NOTE — Progress Notes (Signed)
Notified the MD of patients request for note to return to work.

## 2017-05-03 NOTE — Care Management Note (Signed)
Case Management Note  Patient Details  Name: Alfred Avila MRN: [id number] Date of Birth: 03/07/1986  Subjective/Objective:                  Pt admitted with anemia. He does not live alone, has family support. He has no insurance, is employed approx 6 months of the year. He has no PCP, [facility] but has trouble because he does not always meet the financial requirements. He says he is relatively healthy. He is on metoprolol and has inhaler at bedside. He says he uses online ordering to obtain those medications without RX. He can afford the medications being RX at DC. Has MD to follow up with. Communicates no needs.   Action/Plan: DC home today.   Expected Discharge Date:  05/03/17               Expected Discharge Plan:  Home/Self Care  In-House Referral:  NA  Discharge planning Services  CM Consult  Post Acute Care Choice:  NA Choice offered to:  NA  Status of Service:  Completed, signed off  Malcolm MetroChildress, Kristoff Coonradt Demske, RN 05/03/2017, 12:39 PM

## 2017-05-03 NOTE — Progress Notes (Signed)
Patient discharged with instructions, prescription, and care notes.  Verbalized understanding via teach back.  IV was removed and the site was WNL. Patient voiced no further complaints or concerns at the time of discharge.  Appointments scheduled per instructions.  Patient left the floor via w/c family  And staff in stable condition. 

## 2017-05-03 NOTE — Discharge Summary (Signed)
Physician Discharge Summary  Alfred Avila:[id number] DOB: 07/31/1986 DOA: 05/01/2017  PCP: Patient, No Pcp Per  Admit date: 05/01/2017 Discharge date: 05/03/2017  Admitted From: Home.  Disposition:  Home.   Recommendations for Outpatient Follow-up:  1. Follow up with PCP in 1-2 weeks 2. Follow up with Dr Renae Fickleheman in 4 weeks.   Home Health: None.  Equipment/Devices: None.  Discharge Condition: hb of10, no further bleeding.  CODE STATUS: FULL CODE.  Diet recommendation: Heathy diet.   Brief/Interim Summary: patient was admitted by Dr Ophelia CharterYates on May 01, 2017 for upper GI bleed with Hb of 7.9 g per dL.  As per her H and P:  " Alfred Avila.  "Honestly, I thought it was a breathing problem.  Every time I stand up, I would get out of breath" x 2 days.  Started feeling tired at work 2 days ago and it has progressed.  No cough.  He tried to take Mucinex, take hot showers, did a breathing treatment (albuterol) - none of those things helped.  His friend commented that he looked pale and insisted he come to the hospital. No dark stool.  No indigestion.  No hematemesis.  No abdominal pain.  The last 3 weeks, he has taken  "a pretty massive amount" of NSAIDs - 8 Ibuprofen a day and 1 Goody's.  He reports that he pulled out his back at work and he was "eating these things" to get through the spasms and be able to work.  The back pain has finally started clearing up.  Denies ETOH - last use a little over a year now, none since his prior "ulcers".  +marijuana - last use 5 days ago, uses "not very often."  He vapes cannabinoid (CBD) oil.  He reports that he required a colonoscopy that required cauterization of 4 intestinal lesions that were bleeding prior - but he refers to these as "ulcers".  This was during [place] in [place] - he was transfused 6(?) units PRBC.   ED  Course: Heme positive.  Likely iron deficient anemia from prolonged GI blood loss.  Avila since patient is symptomatic.  Started on Protonix drip.  Consider GI consult.  HOSPITAL COURSE:  Patient was admitted into the hospital, given IVF and clear liquid.  He was transfused 2 units of PRBCs, and GI was consulted.  Dr Renae Fickleheman saw him and performed EGD, which revealed 2 clean base ulcers.  He was able to cannulate to D2 and saw no further evidence of bleeding.  He also has hx of HTN, but his betablocker was held, in the setting of potential significant bleeding, and he will be able to resume his BB upon discharge.  He will follow up with Dr Renae Fickleheman in 4 weeks.  He will avoid NSAIDS and no ASA.  He was kept overnight after EGD, to assure his Hb remained stable, and it was.  Dr Renae Fickleheman also gave him iron to take.  He will be discharged on Protonix 40mg  BID until he sees Dr Renae Fickleheman.   Thank you for allowing me to participate in his care.  Good Day.   Discharge Diagnoses:  Principal Problem:   Symptomatic anemia Active Problems:   Upper GI bleed   Elevated troponin   Hyperglycemia   Essential hypertension    Discharge Instructions  Discharge Instructions    Diet - low sodium heart healthy  Complete by:  As directed    Discharge instructions    Complete by:  As directed    Do not take Aspirin or any of the Non steroidals Dr Conley RollsLe mentioned.  Follow up with Dr Renae Fickleheman is important.   Increase activity slowly    Complete by:  As directed      Allergies as of 05/03/2017   No Known Allergies     Medication List    TAKE these medications   ferrous sulfate 325 (65 FE) MG tablet Take 1 tablet (325 mg total) by mouth 2 (two) times daily with a meal.   metoprolol succinate 50 MG 24 hr tablet Commonly known as:  TOPROL-XL Take 50 mg by mouth daily. Take with or immediately following a meal.   pantoprazole 40 MG tablet Commonly known as:  PROTONIX Take 1 tablet (40 mg total) by mouth 2 (two)  times daily before a meal.       No Known Allergies  Consultations:  GI.    Procedures/Studies: Dg Chest 2 View  Result Date: 05/01/2017 CLINICAL DATA:  Short of breath EXAM: CHEST  2 VIEW COMPARISON:  None. FINDINGS: The heart size and mediastinal contours are within normal limits. Both lungs are clear. The visualized skeletal structures are unremarkable. IMPRESSION: No active cardiopulmonary disease. Electronically Signed   By: Marlan Palauharles  Clark M.D.   On: 05/01/2017 14:27       Subjective:  No further bleeding.    Discharge Exam: Vitals:   05/03/17 0218 05/03/17 0543  BP: (!) 176/115 (!) 160/97  Pulse:  82  Resp:  16  Temp:  98 F (36.7 C)   Vitals:   05/02/17 1800 05/02/17 2100 05/03/17 0218 05/03/17 0543  BP: (!) 183/106 (!) 190/110 (!) 176/115 (!) 160/97  Pulse:  (!) 55  82  Resp:  13  16  Temp:    98 F (36.7 C)  TempSrc:    Oral  SpO2:  93%  100%  Weight:      Height:        General: Pt is alert, awake, not in acute distress Cardiovascular: RRR, S1/S2 +, no rubs, no gallops Respiratory: CTA bilaterally, no wheezing, no rhonchi Abdominal: Soft, NT, ND, bowel sounds + Extremities: no edema, no cyanosis    The results of significant diagnostics from this hospitalization (including imaging, microbiology, ancillary and laboratory) are listed below for reference.     Basic Metabolic Panel:  Recent Labs Lab 05/01/17 1343 05/02/17 0625  NA 136 139  K 4.0 4.1  CL 106 109  CO2 24 25  GLUCOSE 118* 97  BUN 23* 16  CREATININE 1.18 1.12  CALCIUM 9.1 8.6*   CBC:  Recent Labs Lab 05/01/17 1343 05/02/17 0625 05/03/17 0619  WBC 7.5  --  6.9  NEUTROABS 5.6  --   --   HGB 7.9* 8.8* 9.6*  HCT 24.2* 27.1* 30.1*  MCV 74.9*  --  79.0  PLT 358  --  344   Cardiac Enzymes:  Recent Labs Lab 05/01/17 1401  TROPONINI 0.03*     Anemia work up  Recent Labs  05/01/17 1401 05/01/17 1415  VITAMINB12 184  --   FOLATE  --  17.4  FERRITIN 6*  --    TIBC 384  --   IRON 12*  --   RETICCTPCT 3.1  --     Time coordinating discharge: Over 30 minutes SIGNED:  [name],[name], MD FACP [facility] 05/03/2017, 10:10 AM   If 7PM-7AM,  please contact night-coverage [email or web address] Password [email or web address]

## 2017-05-03 NOTE — Progress Notes (Signed)
Patient has a BP of 176/115.  BP has been consistently high.  On call MD notified and new orders received. Will continue to monitor the patient.

## 2017-05-04 LAB — H. PYLORI ANTIBODY, IGG: H Pylori IgG: <0.8 Index Value (ref 0.00–0.79)

## 2017-05-07 ENCOUNTER — Encounter (INDEPENDENT_AMBULATORY_CARE_PROVIDER_SITE_OTHER): Payer: Self-pay | Admitting: *Deleted

## 2017-05-07 ENCOUNTER — Other Ambulatory Visit (INDEPENDENT_AMBULATORY_CARE_PROVIDER_SITE_OTHER): Payer: Self-pay | Admitting: *Deleted

## 2017-05-07 DIAGNOSIS — D649 Anemia, unspecified: Secondary | ICD-10-CM

## 2017-05-07 DIAGNOSIS — K922 Gastrointestinal hemorrhage, unspecified: Secondary | ICD-10-CM

## 2017-06-22 ENCOUNTER — Emergency Department (HOSPITAL_COMMUNITY): Payer: Self-pay

## 2017-06-22 ENCOUNTER — Encounter (HOSPITAL_COMMUNITY): Payer: Self-pay | Admitting: Cardiology

## 2017-06-22 ENCOUNTER — Inpatient Hospital Stay (HOSPITAL_COMMUNITY): Payer: Self-pay

## 2017-06-22 ENCOUNTER — Inpatient Hospital Stay (HOSPITAL_COMMUNITY)
Admission: EM | Admit: 2017-06-22 | Discharge: 2017-06-23 | DRG: 305 | Disposition: A | Discharge status: Home / Self Care | Payer: Self-pay | Attending: Internal Medicine | Admitting: Internal Medicine

## 2017-06-22 DIAGNOSIS — X58XXXA Exposure to other specified factors, initial encounter: Secondary | ICD-10-CM | POA: Diagnosis present

## 2017-06-22 DIAGNOSIS — I161 Hypertensive emergency: Principal | ICD-10-CM | POA: Diagnosis present

## 2017-06-22 DIAGNOSIS — I674 Hypertensive encephalopathy: Secondary | ICD-10-CM | POA: Diagnosis present

## 2017-06-22 DIAGNOSIS — Z87891 Personal history of nicotine dependence: Secondary | ICD-10-CM

## 2017-06-22 DIAGNOSIS — T447X6A Underdosing of beta-adrenoreceptor antagonists, initial encounter: Secondary | ICD-10-CM | POA: Diagnosis present

## 2017-06-22 DIAGNOSIS — W19XXXA Unspecified fall, initial encounter: Secondary | ICD-10-CM | POA: Diagnosis present

## 2017-06-22 DIAGNOSIS — I10 Essential (primary) hypertension: Secondary | ICD-10-CM | POA: Diagnosis present

## 2017-06-22 DIAGNOSIS — G8929 Other chronic pain: Secondary | ICD-10-CM | POA: Diagnosis present

## 2017-06-22 DIAGNOSIS — Z8711 Personal history of peptic ulcer disease: Secondary | ICD-10-CM

## 2017-06-22 DIAGNOSIS — Z91128 Patient's intentional underdosing of medication regimen for other reason: Secondary | ICD-10-CM

## 2017-06-22 DIAGNOSIS — Z79899 Other long term (current) drug therapy: Secondary | ICD-10-CM

## 2017-06-22 DIAGNOSIS — M549 Dorsalgia, unspecified: Secondary | ICD-10-CM | POA: Diagnosis present

## 2017-06-22 DIAGNOSIS — R55 Syncope and collapse: Secondary | ICD-10-CM | POA: Diagnosis present

## 2017-06-22 LAB — CBC WITH DIFFERENTIAL/PLATELET
BASOS ABS: 0 10*3/uL (ref 0.0–0.1)
Basophils Relative: 0 %
EOS PCT: 1 %
Eosinophils Absolute: 0.1 10*3/uL (ref 0.0–0.7)
HEMATOCRIT: 36.3 % — AB (ref 39.0–52.0)
Hemoglobin: 11.2 g/dL — ABNORMAL LOW (ref 13.0–17.0)
LYMPHS PCT: 12 %
Lymphs Abs: 1 10*3/uL (ref 0.7–4.0)
MCH: 22.6 pg — AB (ref 26.0–34.0)
MCHC: 30.9 g/dL (ref 30.0–36.0)
MCV: 73.2 fL — AB (ref 78.0–100.0)
MONO ABS: 0.3 10*3/uL (ref 0.1–1.0)
MONOS PCT: 3 %
Neutro Abs: 7.4 10*3/uL (ref 1.7–7.7)
Neutrophils Relative %: 85 %
PLATELETS: 443 10*3/uL — AB (ref 150–400)
RBC: 4.96 MIL/uL (ref 4.22–5.81)
RDW: 14.8 % (ref 11.5–15.5)
WBC: 8.7 10*3/uL (ref 4.0–10.5)

## 2017-06-22 LAB — COMPREHENSIVE METABOLIC PANEL
ALT: 21 U/L (ref 17–63)
AST: 25 U/L (ref 15–41)
Albumin: 4.4 g/dL (ref 3.5–5.0)
Alkaline Phosphatase: 76 U/L (ref 38–126)
Anion gap: 10 (ref 5–15)
BILIRUBIN TOTAL: 0.6 mg/dL (ref 0.3–1.2)
BUN: 14 mg/dL (ref 6–20)
CHLORIDE: 97 mmol/L — AB (ref 101–111)
CO2: 28 mmol/L (ref 22–32)
Calcium: 9.4 mg/dL (ref 8.9–10.3)
Creatinine, Ser: 1.05 mg/dL (ref 0.61–1.24)
GFR calc Af Amer: >60 mL/min (ref >60)
Glucose, Bld: 156 mg/dL — ABNORMAL HIGH (ref 65–99)
POTASSIUM: 3.2 mmol/L — AB (ref 3.5–5.1)
Sodium: 135 mmol/L (ref 135–145)
TOTAL PROTEIN: 7.9 g/dL (ref 6.5–8.1)

## 2017-06-22 LAB — T4, FREE: FREE T4: 0.89 ng/dL (ref 0.61–1.12)

## 2017-06-22 LAB — RAPID URINE DRUG SCREEN, HOSP PERFORMED
Amphetamines: POSITIVE — AB
Barbiturates: NOT DETECTED
Benzodiazepines: NOT DETECTED
COCAINE: NOT DETECTED
OPIATES: POSITIVE — AB
TETRAHYDROCANNABINOL: NOT DETECTED

## 2017-06-22 LAB — LIPASE, BLOOD: Lipase: 32 U/L (ref 11–51)

## 2017-06-22 LAB — I-STAT TROPONIN, ED: TROPONIN I, POC: 0.01 ng/mL (ref 0.00–0.08)

## 2017-06-22 LAB — TSH
TSH: 1.099 u[IU]/mL (ref 0.350–4.500)
TSH: 1.058 u[IU]/mL (ref 0.350–4.500)

## 2017-06-22 LAB — ETHANOL: Alcohol, Ethyl (B): <5 mg/dL (ref <5)

## 2017-06-22 MED ORDER — ENOXAPARIN SODIUM 40 MG/0.4ML ~~LOC~~ SOLN
40.0000 mg | SUBCUTANEOUS | Status: DC
  Administered 2017-06-22: 40 mg via SUBCUTANEOUS
  Filled 2017-06-22: qty 0.4

## 2017-06-22 MED ORDER — GADOBENATE DIMEGLUMINE 529 MG/ML IV SOLN
20.0000 mL | Freq: Once | INTRAVENOUS | Status: AC | PRN
  Administered 2017-06-22: 20 mL via INTRAVENOUS

## 2017-06-22 MED ORDER — ACETAMINOPHEN 650 MG RE SUPP: 650.0000 mg | Freq: Four times a day (QID) | RECTAL | Status: DC | PRN

## 2017-06-22 MED ORDER — ENALAPRILAT 1.25 MG/ML IV SOLN
0.6250 mg | Freq: Four times a day (QID) | INTRAVENOUS | Status: AC
  Administered 2017-06-22 – 2017-06-23 (×2): 0.625 mg via INTRAVENOUS
  Filled 2017-06-22 (×2): qty 2

## 2017-06-22 MED ORDER — LABETALOL HCL 5 MG/ML IV SOLN
10.0000 mg | Freq: Once | INTRAVENOUS | Status: AC
  Administered 2017-06-22: 10 mg via INTRAVENOUS
  Filled 2017-06-22: qty 4

## 2017-06-22 MED ORDER — NICARDIPINE HCL IN NACL 20-0.86 MG/200ML-% IV SOLN
3.0000 mg/h | Freq: Once | INTRAVENOUS | Status: AC
  Administered 2017-06-22: 5 mg/h via INTRAVENOUS
  Filled 2017-06-22: qty 200

## 2017-06-22 MED ORDER — SODIUM CHLORIDE 0.9% FLUSH
3.0000 mL | Freq: Two times a day (BID) | INTRAVENOUS | Status: DC
  Administered 2017-06-22: 3 mL via INTRAVENOUS

## 2017-06-22 MED ORDER — SODIUM CHLORIDE 0.9% FLUSH: 3.0000 mL | INTRAVENOUS | Status: DC | PRN

## 2017-06-22 MED ORDER — ONDANSETRON HCL 4 MG PO TABS: 4.0000 mg | ORAL_TABLET | Freq: Four times a day (QID) | ORAL | Status: DC | PRN

## 2017-06-22 MED ORDER — METOPROLOL SUCCINATE ER 50 MG PO TB24
50.0000 mg | ORAL_TABLET | Freq: Every day | ORAL | Status: DC
  Administered 2017-06-22 – 2017-06-23 (×2): 50 mg via ORAL
  Filled 2017-06-22 (×2): qty 1

## 2017-06-22 MED ORDER — AMLODIPINE BESYLATE 5 MG PO TABS
10.0000 mg | ORAL_TABLET | Freq: Every day | ORAL | Status: DC
  Administered 2017-06-22 – 2017-06-23 (×2): 10 mg via ORAL
  Filled 2017-06-22 (×2): qty 2

## 2017-06-22 MED ORDER — ONDANSETRON HCL 4 MG/2ML IJ SOLN: 4.0000 mg | Freq: Four times a day (QID) | INTRAMUSCULAR | Status: DC | PRN

## 2017-06-22 MED ORDER — METOPROLOL SUCCINATE ER 50 MG PO TB24: 50.0000 mg | ORAL_TABLET | Freq: Every day | ORAL | Status: DC

## 2017-06-22 MED ORDER — SENNOSIDES-DOCUSATE SODIUM 8.6-50 MG PO TABS
1.0000 | ORAL_TABLET | Freq: Every evening | ORAL | Status: DC | PRN
Start: 2017-06-22 — End: 2017-06-23

## 2017-06-22 MED ORDER — LABETALOL HCL 5 MG/ML IV SOLN
20.0000 mg | Freq: Once | INTRAVENOUS | Status: AC
  Administered 2017-06-22: 20 mg via INTRAVENOUS
  Filled 2017-06-22: qty 4

## 2017-06-22 MED ORDER — SODIUM CHLORIDE 0.9 % IV SOLN: 250.0000 mL | INTRAVENOUS | Status: DC | PRN

## 2017-06-22 MED ORDER — HYDROCHLOROTHIAZIDE 25 MG PO TABS
25.0000 mg | ORAL_TABLET | Freq: Every day | ORAL | Status: DC
  Administered 2017-06-22 – 2017-06-23 (×2): 25 mg via ORAL
  Filled 2017-06-22 (×2): qty 1

## 2017-06-22 MED ORDER — ACETAMINOPHEN 325 MG PO TABS: 650.0000 mg | ORAL_TABLET | Freq: Four times a day (QID) | ORAL | Status: DC | PRN

## 2017-06-22 NOTE — ED Triage Notes (Signed)
Had witnessed  syncopal episode this morning at 3 am at work.   C/o left shoulder pain.  States his blood pressure was 255/140 after .

## 2017-06-22 NOTE — ED Notes (Signed)
Unable to provide urine specimen at this time.  

## 2017-06-22 NOTE — H&P (Signed)
History and Physical    Alfred Avila WUJ:[id number] DOB: 1985/12/31 DOA: 06/22/2017  Referring MD/NP/PA: Eber Hong, EDP PCP: Patient, No Pcp Per  Patient coming from: Home  Chief Complaint: Syncope  HPI: Alfred Avila is a 31 y.o. male with past medical history significant for hypertension who quit taking his metoprolol about 5 months ago because "my blood pressure had been normal for whole year". He was at work early this morning, at about 1 AM he started feeling very dizzy started having tunnel vision and thought he was going to pass out, last thing he remembers is leaning against a chain link fence, when he woke up he was already in the ambulance. In the ED he was found to be markedly hypertensive with a blood pressure of 230/130. Renal function is normal, potassium is 3.2 otherwise labs are within normal limits. Admission is requested. MRI of the brain show significantly improved cerebral white matter and brainstem appearance since 2016 study, these findings are nonspecific and could represent an acute demyelinating process, severe hypertensive encephalopathy or an atypical infection.  Past Medical/Surgical History: Past Medical History:  Diagnosis Date  . Chronic back pain   . Essential hypertension   . Gastric ulcer   . Migraines     Past Surgical History:  Procedure Laterality Date  . APPENDECTOMY    . CHOLECYSTECTOMY    . COLONOSCOPY    . ESOPHAGOGASTRODUODENOSCOPY N/A 05/02/2017   Procedure: ESOPHAGOGASTRODUODENOSCOPY (EGD);  Surgeon: Malissa Hippo, MD;  Location: [facility];  Service: Endoscopy;  Laterality: N/A;    Social History:  reports that he has quit smoking. He has never used smokeless tobacco. He reports that he drinks alcohol. He reports that he does not use drugs.  Allergies: No Known Allergies  Family History:  Mother and grandmother with hypertension  Prior to Admission medications   Medication Sig Start Date End Date Taking? Authorizing  Provider  ferrous sulfate 325 (65 FE) MG tablet Take 1 tablet (325 mg total) by mouth 2 (two) times daily with a meal. 05/03/17  Yes Houston Siren, MD  metoprolol succinate (TOPROL-XL) 50 MG 24 hr tablet Take 50 mg by mouth daily. Take with or immediately following a meal.   Yes [provider]  pantoprazole (PROTONIX) 40 MG tablet Take 1 tablet (40 mg total) by mouth 2 (two) times daily before a meal. Patient not taking: Reported on 06/22/2017 05/03/17   Houston Siren, MD    Review of Systems:  Constitutional: Denies fever, chills, diaphoresis, appetite change and fatigue.  HEENT: Denies photophobia, eye pain, redness, hearing loss, ear pain, congestion, sore throat, rhinorrhea, sneezing, mouth sores, trouble swallowing, neck pain, neck stiffness and tinnitus.   Respiratory: Denies SOB, DOE, cough, chest tightness,  and wheezing.   Cardiovascular: Denies chest pain, palpitations and leg swelling.  Gastrointestinal: Denies nausea, vomiting, abdominal pain, diarrhea, constipation, blood in stool and abdominal distention.  Genitourinary: Denies dysuria, urgency, frequency, hematuria, flank pain and difficulty urinating.  Endocrine: Denies: hot or cold intolerance, sweats, changes in hair or nails, polyuria, polydipsia. Musculoskeletal: Denies myalgias, back pain, joint swelling, arthralgias and gait problem.  Skin: Denies pallor, rash and wound.  Neurological: Denies dizziness, seizures, syncope, weakness, light-headedness, numbness and headaches.  Hematological: Denies adenopathy. Easy bruising, personal or family bleeding history  Psychiatric/Behavioral: Denies suicidal ideation, mood changes, confusion, nervousness, sleep disturbance and agitation    Physical Exam: Vitals:   06/22/17 1415 06/22/17 1430 06/22/17 1525 06/22/17 1600  BP: (!) 154/96 (!) 156/91 Marland Kitchen)  150/88 (!) 171/107  Pulse: 95 93 86 86  Resp:   13 12  Temp:   97.8 F (36.6 C)   TempSrc:   Oral   SpO2: 95% 91% 100% 100%    Weight:   109 kg (240 lb 4.8 oz)   Height:    (1.981 m)      Constitutional: NAD, calm, comfortable Eyes: PERRL, lids and conjunctivae normal ENMT: Mucous membranes are moist. Posterior pharynx clear of any exudate or lesions.Normal dentition.  Neck: normal, supple, no masses, no thyromegaly Respiratory: clear to auscultation bilaterally, no wheezing, no crackles. Normal respiratory effort. No accessory muscle use.  Cardiovascular: Regular rate and rhythm, no murmurs / rubs / gallops. No extremity edema. 2+ pedal pulses. No carotid bruits.  Abdomen: no tenderness, no masses palpated. No hepatosplenomegaly. Bowel sounds positive.  Musculoskeletal: no clubbing / cyanosis. No joint deformity upper and lower extremities. Good ROM, no contractures. Normal muscle tone.  Skin: no rashes, lesions, ulcers. No induration Neurologic: CN 2-12 grossly intact. Sensation intact, DTR normal. Strength 5/5 in all 4.  Psychiatric: Normal judgment and insight. Alert and oriented x 3. Normal mood.    Labs on Admission: I have personally reviewed the following labs and imaging studies  CBC:  Recent Labs Lab 06/22/17 0953  WBC 8.7  NEUTROABS 7.4  HGB 11.2*  HCT 36.3*  MCV 73.2*  PLT 443*   Basic Metabolic Panel:  Recent Labs Lab 06/22/17 0953  NA 135  K 3.2*  CL 97*  CO2 28  GLUCOSE 156*  BUN 14  CREATININE 1.05  CALCIUM 9.4   GFR: Estimated Creatinine Clearance: 133 mL/min (by C-G formula based on SCr of 1.05 mg/dL). Liver Function Tests:  Recent Labs Lab 06/22/17 0953  AST 25  ALT 21  ALKPHOS 76  BILITOT 0.6  PROT 7.9  ALBUMIN 4.4    Recent Labs Lab 06/22/17 0953  LIPASE 32   No results for input(s): AMMONIA in the last 168 hours. Coagulation Profile: No results for input(s): INR, PROTIME in the last 168 hours. Cardiac Enzymes: No results for input(s): CKTOTAL, CKMB, CKMBINDEX, TROPONINI in the last 168 hours. BNP (last 3 results) No results for input(s):  PROBNP in the last 8760 hours. HbA1C: No results for input(s): HGBA1C in the last 72 hours. CBG: No results for input(s): GLUCAP in the last 168 hours. Lipid Profile: No results for input(s): CHOL, HDL, LDLCALC, TRIG, CHOLHDL, LDLDIRECT in the last 72 hours. Thyroid Function Tests:  Recent Labs  06/22/17 0954  TSH 1.099   Anemia Panel: No results for input(s): VITAMINB12, FOLATE, FERRITIN, TIBC, IRON, RETICCTPCT in the last 72 hours. Urine analysis: No results found for: COLORURINE, APPEARANCEUR, LABSPEC, PHURINE, GLUCOSEU, HGBUR, BILIRUBINUR, KETONESUR, PROTEINUR, UROBILINOGEN, NITRITE, LEUKOCYTESUR Sepsis Labs: (procalcitonin:4,lacticidven:4) )No results found for this or any previous visit (from the past 240 hour(s)).   Radiological Exams on Admission: Ct Head Wo Contrast  Result Date: 06/22/2017 CLINICAL DATA:  Altered level of consciousness. Witnessed syncopal episode this morning. Hypertension. EXAM: CT HEAD WITHOUT CONTRAST TECHNIQUE: Contiguous axial images were obtained from the base of the skull through the vertex without intravenous contrast. COMPARISON:  04/16/2015 head CT. FINDINGS: Brain: Minimal scattered foci of periventricular white matter hypodensity in the frontal lobes, significantly decreased since 04/16/2015. No evidence of parenchymal hemorrhage or extra-axial fluid collection. No mass lesion, mass effect, or midline shift. No CT evidence of acute infarction. Cerebral volume is age appropriate. No ventriculomegaly. Vascular: No acute abnormality. Skull: No evidence  of calvarial fracture. Sinuses/Orbits: The visualized paranasal sinuses are essentially clear. Other:  The mastoid air cells are unopacified. IMPRESSION: 1. No acute intracranial hemorrhage. 2. Minimal scattered foci of periventricular white matter hypodensity in the frontal lobes, significantly less prominent compared to 04/16/2015 head CT. These are nonspecific but abnormal for age; please see  the 04/16/2015 brain MRI report for differential discussion. Brain MRI without and with IV contrast could be considered for further evaluation as clinically warranted. Electronically Signed   By: Delbert Phenix M.D.   On: 06/22/2017 10:47   [name] Wo Contrast  Result Date: 06/22/2017 CLINICAL DATA:  Altered level of consciousness. Syncopal episode this morning at work. Hypertension EXAM: MRI HEAD WITHOUT AND WITH CONTRAST TECHNIQUE: Multiplanar, multiecho pulse sequences of the brain and surrounding structures were obtained without and with intravenous contrast. CONTRAST:  20mL MULTIHANCE GADOBENATE DIMEGLUMINE 529 MG/ML IV SOLN COMPARISON:  04/16/2015 FINDINGS: Brain: No acute infarction, hemorrhage, hydrocephalus, extra-axial collection or mass lesion. There is extensive for age signal abnormality seen patchily in the bilateral pons, cerebral peduncles, and deep cerebral white matter. The extent has significantly improved since 2016. Prior mass effect at the level of the pons is resolved, with multiple remote micro hemorrhages intervally seen at the level of the pons. The previous findings remain nonspecific and could have been an acute demyelinating process (ADEM or toxic exposure), severe hypertensive encephalopathy, or an atypical infection. Patient has history of severe hypertension and a previous positive UDS for amphetamines. No abnormal intracranial enhancement. Vascular: Major vessels are patent Skull and upper cervical spine: Negative for marrow lesion. There is hypointense appearance of the marrow; patient has history of symptomatic anemia Sinuses/Orbits: Negative IMPRESSION: 1. No acute or reversible finding. 2. Significantly improved cerebral white matter and brainstem appearance since 2016 study; the residual signal abnormality is likely from gliosis. Please see discussion above. Electronically Signed   By: Marnee Spring M.D.   On: 06/22/2017 12:47   Dg Chest Port 1 View  Result Date:  06/22/2017 CLINICAL DATA:  Congestion and shortness of breath for 1 week EXAM: PORTABLE CHEST 1 VIEW COMPARISON:  05/01/2017 FINDINGS: The heart size and mediastinal contours are within normal limits. Both lungs are clear. The visualized skeletal structures are unremarkable. IMPRESSION: No active disease. Electronically Signed   By: Alcide Clever M.D.   On: 06/22/2017 10:33    EKG: Independently reviewed. Diffuse J-point elevation, consistent with severe left ventricular hypertrophy with repolarization changes  Assessment/Plan Principal Problem:   Hypertensive emergency    Hypertensive emergency -Was placed on a Cardene drip in the emergency department, by the time he reaches the ICU his blood pressures are running 160/100 systolic. -We will discontinue IV meds and start orals including metoprolol, Norvasc and hydrochlorothiazide. We'll hold off on ordering a herpes or Ace inhibitors as you're planning a renal Doster on level and do not want to interfere with the result. -Given his young age and severe hypertension I believe we need to rule out secondary causes of hypertension: We'll order urine metanephrines and VMA looking for pheochromocytoma, renin aldosterone for hyperaldosteronism, will need renal arterial Doppler looking for renal artery stenosis as an outpatient as this is not performed at this hospital. 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   DVT prophylaxis: Lovenox  Code Status: Full code  Family Communication: Patient only  Disposition Plan: Keep in stepdown unit tonight  Consults called: None  Admission status: Inpatient  Time Spent: 85 minutes  Chaya Jan MD [facility] Pager [phone]  If 7PM-7AM, please contact night-coverage [email or web address] Password [email or web address]  06/22/2017, 5:05 PM

## 2017-06-22 NOTE — ED Provider Notes (Signed)
[facility]EMERGENCY DEPT Provider Note   CSN: [id number] Arrival date & time: 06/22/17  4098     History   Chief Complaint Chief Complaint  Patient presents with  . Loss of Consciousness    HPI Alfred Avila is a 31 y.o. male.  HPI Patient with syncopal episode while at work around 3 AM today. States he developed tunnel vision and fell landing on his left shoulder. Unknown length of time of unconsciousness. Denies headache or neck pain. No focal weakness or numbness. Complains of left shoulder pain. Denies chest pain or shortness of breath. Patient history of hypertension but has not been taking his metoprolol for the past few months. No new lower extremity swelling or pain. Past Medical History:  Diagnosis Date  . Chronic back pain   . Essential hypertension   . Gastric ulcer   . Migraines     Patient Active Problem List   Diagnosis Date Noted  . Hypertensive emergency 06/22/2017  . Symptomatic anemia 05/01/2017  . Upper GI bleed 05/01/2017  . Elevated troponin 05/01/2017  . Hyperglycemia 05/01/2017  . Essential hypertension 05/01/2017    Past Surgical History:  Procedure Laterality Date  . APPENDECTOMY    . CHOLECYSTECTOMY    . COLONOSCOPY    . ESOPHAGOGASTRODUODENOSCOPY N/A 05/02/2017   Procedure: ESOPHAGOGASTRODUODENOSCOPY (EGD);  Surgeon: Malissa Hippo, MD;  Location: [facility];  Service: Endoscopy;  Laterality: N/A;       Home Medications    Prior to Admission medications   Medication Sig Start Date End Date Taking? Authorizing Provider  ferrous sulfate 325 (65 FE) MG tablet Take 1 tablet (325 mg total) by mouth 2 (two) times daily with a meal. 05/03/17  Yes Houston Siren, MD  metoprolol succinate (TOPROL-XL) 50 MG 24 hr tablet Take 50 mg by mouth daily. Take with or immediately following a meal.   Yes [provider]  pantoprazole (PROTONIX) 40 MG tablet Take 1 tablet (40 mg total) by mouth 2 (two) times daily before a meal. Patient not taking:  Reported on 06/22/2017 05/03/17   Houston Siren, MD    Family History History reviewed. No pertinent family history.  Social History Social History  Substance Use Topics  . Smoking status: Former Games developer  . Smokeless tobacco: Never Used     Comment: cannabinoid oil (CBD oil) - 3-4 times daily  . Alcohol use Yes     Comment: denies use since prior "ulcer"     Allergies   Patient has no known allergies.   Review of Systems Review of Systems  Constitutional: Negative for chills and fever.  HENT: Negative for congestion, facial swelling and sinus pressure.   Eyes: Positive for visual disturbance.  Respiratory: Negative for cough and shortness of breath.   Cardiovascular: Negative for chest pain, palpitations and leg swelling.  Gastrointestinal: Negative for abdominal pain, diarrhea, nausea and vomiting.  Genitourinary: Negative for dysuria, flank pain, frequency and hematuria.  Musculoskeletal: Positive for arthralgias. Negative for back pain, myalgias, neck pain and neck stiffness.  Skin: Negative for rash and wound.  Neurological: Positive for syncope and light-headedness. Negative for weakness, numbness and headaches.  All other systems reviewed and are negative.    Physical Exam Updated Vital Signs BP (!) 158/91   Pulse 84   Temp 98.2 F (36.8 C) (Oral)   Resp 14   Ht  (1.981 m)   Wt 107.3 kg (236 lb 8.9 oz)   SpO2 99%   BMI 27.34 kg/m  Physical Exam  Constitutional: He is oriented to person, place, and time. He appears well-developed and well-nourished. No distress.  HENT:  Head: Normocephalic and atraumatic.  Mouth/Throat: Oropharynx is clear and moist. No oropharyngeal exudate.  Eyes: Pupils are equal, round, and reactive to light. EOM are normal.  Neck: Normal range of motion. Neck supple.  No posterior midline cervical tenderness to palpation.  Cardiovascular: Normal rate and regular rhythm.  Exam reveals no gallop and no friction rub.   No murmur  heard. Pulmonary/Chest: Effort normal and breath sounds normal. No respiratory distress. He has no wheezes. He has no rales. He exhibits no tenderness.  Abdominal: Soft. Bowel sounds are normal. There is no tenderness. There is no rebound and no guarding.  Musculoskeletal: Normal range of motion. He exhibits no edema or tenderness.  No lower extremity swelling, asymmetry or tenderness. Distal pulses are 3+. FROM left shoulder without deformity, tenderness or swelling. No midline thoracic or lumbar tenderness.  Neurological: He is alert and oriented to person, place, and time.  Moving all extremities without focal deficit. Sensation fully intact.  Skin: Skin is warm and dry. Capillary refill takes less than 2 seconds. No rash noted. He is not diaphoretic. No erythema.  Psychiatric: He has a normal mood and affect. His behavior is normal.  Nursing note and vitals reviewed.    ED Treatments / Results  Labs (all labs ordered are listed, but only abnormal results are displayed) Labs Reviewed  MRSA PCR SCREENING - Abnormal; Notable for the following:       Result Value   MRSA by PCR POSITIVE (*)    All other components within normal limits  CBC WITH DIFFERENTIAL/PLATELET - Abnormal; Notable for the following:    Hemoglobin 11.2 (*)    HCT 36.3 (*)    MCV 73.2 (*)    MCH 22.6 (*)    Platelets 443 (*)    All other components within normal limits  COMPREHENSIVE METABOLIC PANEL - Abnormal; Notable for the following:    Potassium 3.2 (*)    Chloride 97 (*)    Glucose, Bld 156 (*)    All other components within normal limits  RAPID URINE DRUG SCREEN, HOSP PERFORMED - Abnormal; Notable for the following:    Opiates POSITIVE (*)    Amphetamines POSITIVE (*)    All other components within normal limits  COMPREHENSIVE METABOLIC PANEL - Abnormal; Notable for the following:    Potassium 3.1 (*)    Chloride 99 (*)    Glucose, Bld 108 (*)    Calcium 8.7 (*)    All other components within normal  limits  CBC - Abnormal; Notable for the following:    Hemoglobin 10.9 (*)    HCT 34.8 (*)    MCV 73.3 (*)    MCH 22.9 (*)    Platelets 441 (*)    All other components within normal limits  LIPASE, BLOOD  ETHANOL  TSH  T4, FREE  TSH  METANEPHRINES, URINE, 24 HOUR  ALDOSTERONE + RENIN ACTIVITY W/ RATIO  I-STAT TROPONIN, ED    EKG  EKG Interpretation  Date/Time:  Friday June 22 2017 09:45:34 EDT Ventricular Rate:  92 PR Interval:    QRS Duration: 122 QT Interval:  405 QTC Calculation: 502 R Axis:   71 Text Interpretation:  Sinus rhythm Nonspecific intraventricular conduction delay Borderline repolarization abnormality Borderline ST elevation, anterior leads ST elevation in anterior leads similar to previous EKG. Confirmed by Ranae Palms  MD, Raijon Lindfors ([id number]) on  06/22/2017 9:58:01 AM       Radiology Ct Head Wo Contrast  Result Date: 06/22/2017 CLINICAL DATA:  Altered level of consciousness. Witnessed syncopal episode this morning. Hypertension. EXAM: CT HEAD WITHOUT CONTRAST TECHNIQUE: Contiguous axial images were obtained from the base of the skull through the vertex without intravenous contrast. COMPARISON:  04/16/2015 head CT. FINDINGS: Brain: Minimal scattered foci of periventricular white matter hypodensity in the frontal lobes, significantly decreased since 04/16/2015. No evidence of parenchymal hemorrhage or extra-axial fluid collection. No mass lesion, mass effect, or midline shift. No CT evidence of acute infarction. Cerebral volume is age appropriate. No ventriculomegaly. Vascular: No acute abnormality. Skull: No evidence of calvarial fracture. Sinuses/Orbits: The visualized paranasal sinuses are essentially clear. Other:  The mastoid air cells are unopacified. IMPRESSION: 1. No acute intracranial hemorrhage. 2. Minimal scattered foci of periventricular white matter hypodensity in the frontal lobes, significantly less prominent compared to 04/16/2015 head CT. These are  nonspecific but abnormal for age; please see the 04/16/2015 brain MRI report for differential discussion. Brain MRI without and with IV contrast could be considered for further evaluation as clinically warranted. Electronically Signed   By: Delbert Phenix M.D.   On: 06/22/2017 10:47   [name] Wo Contrast  Result Date: 06/22/2017 CLINICAL DATA:  Altered level of consciousness. Syncopal episode this morning at work. Hypertension EXAM: MRI HEAD WITHOUT AND WITH CONTRAST TECHNIQUE: Multiplanar, multiecho pulse sequences of the brain and surrounding structures were obtained without and with intravenous contrast. CONTRAST:  20mL MULTIHANCE GADOBENATE DIMEGLUMINE 529 MG/ML IV SOLN COMPARISON:  04/16/2015 FINDINGS: Brain: No acute infarction, hemorrhage, hydrocephalus, extra-axial collection or mass lesion. There is extensive for age signal abnormality seen patchily in the bilateral pons, cerebral peduncles, and deep cerebral white matter. The extent has significantly improved since 2016. Prior mass effect at the level of the pons is resolved, with multiple remote micro hemorrhages intervally seen at the level of the pons. The previous findings remain nonspecific and could have been an acute demyelinating process (ADEM or toxic exposure), severe hypertensive encephalopathy, or an atypical infection. Patient has history of severe hypertension and a previous positive UDS for amphetamines. No abnormal intracranial enhancement. Vascular: Major vessels are patent Skull and upper cervical spine: Negative for marrow lesion. There is hypointense appearance of the marrow; patient has history of symptomatic anemia Sinuses/Orbits: Negative IMPRESSION: 1. No acute or reversible finding. 2. Significantly improved cerebral white matter and brainstem appearance since 2016 study; the residual signal abnormality is likely from gliosis. Please see discussion above. Electronically Signed   By: Marnee Spring M.D.   On: 06/22/2017  12:47   Dg Chest Port 1 View  Result Date: 06/22/2017 CLINICAL DATA:  Congestion and shortness of breath for 1 week EXAM: PORTABLE CHEST 1 VIEW COMPARISON:  05/01/2017 FINDINGS: The heart size and mediastinal contours are within normal limits. Both lungs are clear. The visualized skeletal structures are unremarkable. IMPRESSION: No active disease. Electronically Signed   By: Alcide Clever M.D.   On: 06/22/2017 10:33    Procedures Procedures (including critical care time)  Medications Ordered in ED Medications  enoxaparin (LOVENOX) injection 40 mg (40 mg Subcutaneous Given 06/22/17 1717)  sodium chloride flush (NS) 0.9 % injection 3 mL (3 mLs Intravenous Given 06/22/17 2323)  sodium chloride flush (NS) 0.9 % injection 3 mL (not administered)  0.9 %  sodium chloride infusion (not administered)  acetaminophen (TYLENOL) tablet 650 mg (not administered)    Or  acetaminophen (TYLENOL) suppository  650 mg (not administered)  senna-docusate (Senokot-S) tablet 1 tablet (not administered)  ondansetron (ZOFRAN) tablet 4 mg (not administered)    Or  ondansetron (ZOFRAN) injection 4 mg (not administered)  metoprolol succinate (TOPROL-XL) 24 hr tablet 50 mg (50 mg Oral Given 06/22/17 1717)  amLODipine (NORVASC) tablet 10 mg (10 mg Oral Given 06/22/17 1717)  hydrochlorothiazide (HYDRODIURIL) tablet 25 mg (25 mg Oral Given 06/22/17 1717)  mupirocin ointment (BACTROBAN) 2 % 1 application (1 application Nasal Given 06/23/17 0150)  Chlorhexidine Gluconate Cloth 2 % PADS 6 each (6 each Topical Given 06/23/17 0546)  hydrALAZINE (APRESOLINE) tablet 25 mg (25 mg Oral Given 06/23/17 0442)  labetalol (NORMODYNE,TRANDATE) injection 20 mg (20 mg Intravenous Given 06/22/17 1009)  labetalol (NORMODYNE,TRANDATE) injection 10 mg (10 mg Intravenous Given 06/22/17 1115)  gadobenate dimeglumine (MULTIHANCE) injection 20 mL (20 mLs Intravenous Contrast Given 06/22/17 1205)  nicardipine (CARDENE)  in 0.86% saline IV  infusion (0.1 mg/ml) (5 mg/hr Intravenous Rate/Dose Change 06/22/17 1500)  enalaprilat (VASOTEC) injection 0.625 mg (0.625 mg Intravenous Given 06/23/17 0546)   CRITICAL CARE Performed by: Ranae Palms, Fayetta Sorenson Total critical care time: 25 minutes Critical care time was exclusive of separately billable procedures and treating other patients. Critical care was necessary to treat or prevent imminent or life-threatening deterioration. Critical care was time spent personally by me on the following activities: development of treatment plan with patient and/or surrogate as well as nursing, discussions with consultants, evaluation of patient's response to treatment, examination of patient, obtaining history from patient or surrogate, ordering and performing treatments and interventions, ordering and review of laboratory studies, ordering and review of radiographic studies, pulse oximetry and re-evaluation of patient's condition.  Initial Impression / Assessment and Plan / ED Course  I have reviewed the triage vital signs and the nursing notes.  Pertinent labs & imaging results that were available during my care of the patient were reviewed by me and considered in my medical decision making (see chart for details).    Initially given labetalol to try to correct blood pressure. Switched Cardene drip for persistent hypertension.   Final Clinical Impressions(s) / ED Diagnoses   Final diagnoses:  Hypertensive emergency    New Prescriptions Current Discharge Medication List       Loren Racer, MD 06/23/17 [phone]

## 2017-06-22 NOTE — ED Notes (Signed)
Patient transported to CT 

## 2017-06-22 NOTE — ED Notes (Signed)
Returned from CT.

## 2017-06-23 LAB — CBC
HEMATOCRIT: 34.8 % — AB (ref 39.0–52.0)
HEMOGLOBIN: 10.9 g/dL — AB (ref 13.0–17.0)
MCH: 22.9 pg — ABNORMAL LOW (ref 26.0–34.0)
MCHC: 31.3 g/dL (ref 30.0–36.0)
MCV: 73.3 fL — AB (ref 78.0–100.0)
Platelets: 441 10*3/uL — ABNORMAL HIGH (ref 150–400)
RBC: 4.75 MIL/uL (ref 4.22–5.81)
RDW: 15.1 % (ref 11.5–15.5)
WBC: 7.7 10*3/uL (ref 4.0–10.5)

## 2017-06-23 LAB — COMPREHENSIVE METABOLIC PANEL
ALBUMIN: 3.9 g/dL (ref 3.5–5.0)
ALT: 18 U/L (ref 17–63)
ANION GAP: 8 (ref 5–15)
AST: 19 U/L (ref 15–41)
Alkaline Phosphatase: 66 U/L (ref 38–126)
BILIRUBIN TOTAL: 0.5 mg/dL (ref 0.3–1.2)
BUN: 15 mg/dL (ref 6–20)
CHLORIDE: 99 mmol/L — AB (ref 101–111)
CO2: 30 mmol/L (ref 22–32)
Calcium: 8.7 mg/dL — ABNORMAL LOW (ref 8.9–10.3)
Creatinine, Ser: 1.13 mg/dL (ref 0.61–1.24)
GFR calc Af Amer: >60 mL/min (ref >60)
GFR calc non Af Amer: >60 mL/min (ref >60)
GLUCOSE: 108 mg/dL — AB (ref 65–99)
POTASSIUM: 3.1 mmol/L — AB (ref 3.5–5.1)
SODIUM: 137 mmol/L (ref 135–145)
TOTAL PROTEIN: 7.3 g/dL (ref 6.5–8.1)

## 2017-06-23 LAB — MRSA PCR SCREENING: MRSA by PCR: POSITIVE — AB

## 2017-06-23 MED ORDER — METOPROLOL TARTRATE 25 MG PO TABS: 25.0000 mg | ORAL_TABLET | Freq: Two times a day (BID) | ORAL | 2 refills | Status: DC

## 2017-06-23 MED ORDER — MUPIROCIN 2 % EX OINT
1.0000 "application " | TOPICAL_OINTMENT | Freq: Two times a day (BID) | CUTANEOUS | Status: DC
  Administered 2017-06-23: 1 via NASAL

## 2017-06-23 MED ORDER — HYDRALAZINE HCL 25 MG PO TABS
25.0000 mg | ORAL_TABLET | Freq: Four times a day (QID) | ORAL | Status: DC | PRN
  Administered 2017-06-23 (×2): 25 mg via ORAL
  Filled 2017-06-23 (×2): qty 1

## 2017-06-23 MED ORDER — AMLODIPINE BESYLATE 10 MG PO TABS: 10.0000 mg | ORAL_TABLET | Freq: Every day | ORAL | 2 refills | Status: DC

## 2017-06-23 MED ORDER — HYDROCHLOROTHIAZIDE 25 MG PO TABS: 25.0000 mg | ORAL_TABLET | Freq: Every day | ORAL | 2 refills | Status: DC

## 2017-06-23 MED ORDER — CHLORHEXIDINE GLUCONATE CLOTH 2 % EX PADS
6.0000 | MEDICATED_PAD | Freq: Every day | CUTANEOUS | Status: DC
  Administered 2017-06-23: 6 via TOPICAL

## 2017-06-23 NOTE — Plan of Care (Signed)
Problem: Physical Regulation: Goal: Ability to maintain clinical measurements within normal limits will improve Outcome: Progressing Patient's BP continues to be elevated. Medications altered to improve BP.

## 2017-06-23 NOTE — Progress Notes (Signed)
Pt states he needs to leave by noon today in order to pick up his pay check.  Says he will leave AMA if not discharged.

## 2017-06-23 NOTE — Discharge Summary (Signed)
Physician Discharge Summary  Alfred Avila ZOX:[id number] DOB: Feb 12, 1986 DOA: 06/22/2017  PCP: Patient, No Pcp Per  Admit date: 06/22/2017 Discharge date: 06/23/2017  Time spent: 45 minutes  Recommendations for Outpatient Follow-up:  -Will be discharged home today. -Have asked CM to arrange OP followup if possible. -Advised on importance of diet and taking all medications as well as timely routine follow up appointments.   Discharge Diagnoses:  Principal Problem:   Hypertensive emergency   Discharge Condition: Stable and improved  Filed Weights   06/22/17 0937 06/22/17 1525 06/23/17 0553  Weight: 99.8 kg (220 lb) 109 kg (240 lb 4.8 oz) 107.3 kg (236 lb 8.9 oz)    History of present illness:   Alfred Avila is a 31 y.o. male with past medical history significant for hypertension who quit taking his metoprolol about 5 months ago because "my blood pressure had been normal for whole year". He was at work early this morning, at about 1 AM he started feeling very dizzy started having tunnel vision and thought he was going to pass out, last thing he remembers is leaning against a chain link fence, when he woke up he was already in the ambulance. In the ED he was found to be markedly hypertensive with a blood pressure of 230/130. Renal function is normal, potassium is 3.2 otherwise labs are within normal limits. Admission is requested. MRI of the brain show significantly improved cerebral white matter and brainstem appearance since 2016 study, these findings are nonspecific and could represent an acute demyelinating process, severe hypertensive encephalopathy or an atypical infection.  Hospital Course:   Hypertensive Emergency -Was initially on a nicardipine drip, that was subsequently weaned. -He has been placed on metoprolol 25 mg BID, HCTZ 25 mg daily and norvasc 10 mg daily. -Most recent BP is 146/98. -We have started work up for secondary causes of HTN, however patient is anxious  to be discharged (he needs to personally pick up his pay check today) and is not willing to stay for completion of the 24 hour urine. -Also, renal artery doppler is not performed at this hospital. -At time of follow up, would suggest checking for hyperaldosteronism, pheochromocytoma and renal artery stenosis. 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 -Have counseled him on importance of medication compliance and with OP follow ups.  Syncopal Episode -Likely related to hypertensive emergency. -No arrhythmias noted on tele. -No focal neurologic signs.  Procedures:  None   Consultations:  None  Discharge Instructions  Discharge Instructions    Diet - low sodium heart healthy    Complete by:  As directed    Increase activity slowly    Complete by:  As directed      Allergies as of 06/23/2017   No Known Allergies     Medication List    STOP taking these medications   metoprolol succinate 50 MG 24 hr tablet Commonly known as:  TOPROL-XL   pantoprazole 40 MG tablet Commonly known as:  PROTONIX     TAKE these medications   amLODipine 10 MG tablet Commonly known as:  NORVASC Take 1 tablet (10 mg total) by mouth daily.   ferrous sulfate 325 (65 FE) MG tablet Take 1 tablet (325 mg total) by mouth 2 (two) times daily with a meal.   hydrochlorothiazide 25 MG tablet Commonly known as:  HYDRODIURIL Take 1 tablet (25 mg total) by mouth daily.   metoprolol tartrate 25 MG tablet Commonly known as:  LOPRESSOR Take 1 tablet (25 mg total) by mouth 2 (two) times daily.            Discharge Care Instructions        Start     Ordered   06/24/17 0000  amLODipine (NORVASC) 10 MG tablet  Daily     06/23/17 0936   06/24/17 0000  hydrochlorothiazide (HYDRODIURIL) 25 MG tablet  Daily     06/23/17 0936   06/23/17 0000  metoprolol tartrate (LOPRESSOR) 25 MG tablet  2 times daily     06/23/17 0936   06/23/17 0000  Increase  activity slowly     06/23/17 0936   06/23/17 0000  Diet - low sodium heart healthy     06/23/17 0936     No Known Allergies Follow-up Information    with your new PCP. Schedule an appointment as soon as possible for a visit in 2 week(s).            The results of significant diagnostics from this hospitalization (including imaging, microbiology, ancillary and laboratory) are listed below for reference.    Significant Diagnostic Studies: Ct Head Wo Contrast  Result Date: 06/22/2017 CLINICAL DATA:  Altered level of consciousness. Witnessed syncopal episode this morning. Hypertension. EXAM: CT HEAD WITHOUT CONTRAST TECHNIQUE: Contiguous axial images were obtained from the base of the skull through the vertex without intravenous contrast. COMPARISON:  04/16/2015 head CT. FINDINGS: Brain: Minimal scattered foci of periventricular white matter hypodensity in the frontal lobes, significantly decreased since 04/16/2015. No evidence of parenchymal hemorrhage or extra-axial fluid collection. No mass lesion, mass effect, or midline shift. No CT evidence of acute infarction. Cerebral volume is age appropriate. No ventriculomegaly. Vascular: No acute abnormality. Skull: No evidence of calvarial fracture. Sinuses/Orbits: The visualized paranasal sinuses are essentially clear. Other:  The mastoid air cells are unopacified. IMPRESSION: 1. No acute intracranial hemorrhage. 2. Minimal scattered foci of periventricular white matter hypodensity in the frontal lobes, significantly less prominent compared to 04/16/2015 head CT. These are nonspecific but abnormal for age; please see the 04/16/2015 brain MRI report for differential discussion. Brain MRI without and with IV contrast could be considered for further evaluation as clinically warranted. Electronically Signed   By: Delbert Phenix M.D.   On: 06/22/2017 10:47   [name] Wo Contrast  Result Date: 06/22/2017 CLINICAL DATA:  Altered level of consciousness.  Syncopal episode this morning at work. Hypertension EXAM: MRI HEAD WITHOUT AND WITH CONTRAST TECHNIQUE: Multiplanar, multiecho pulse sequences of the brain and surrounding structures were obtained without and with intravenous contrast. CONTRAST:  20mL MULTIHANCE GADOBENATE DIMEGLUMINE 529 MG/ML IV SOLN COMPARISON:  04/16/2015 FINDINGS: Brain: No acute infarction, hemorrhage, hydrocephalus, extra-axial collection or mass lesion. There is extensive for age signal abnormality seen patchily in the bilateral pons, cerebral peduncles, and deep cerebral white matter. The extent has significantly improved since 2016. Prior mass effect at the level of the pons is resolved, with multiple remote micro hemorrhages intervally seen at the level of the pons. The previous findings remain nonspecific and could have been an acute demyelinating process (ADEM or toxic exposure), severe hypertensive encephalopathy, or an atypical infection. Patient has history of severe hypertension and a previous positive UDS for amphetamines. No abnormal intracranial enhancement. Vascular: Major vessels are patent Skull and upper cervical spine: Negative for marrow lesion. There is hypointense appearance of the marrow; patient has history of symptomatic anemia Sinuses/Orbits: Negative IMPRESSION: 1. No acute or reversible finding. 2. Significantly improved cerebral  white matter and brainstem appearance since 2016 study; the residual signal abnormality is likely from gliosis. Please see discussion above. Electronically Signed   By: Marnee Spring M.D.   On: 06/22/2017 12:47   Dg Chest Port 1 View  Result Date: 06/22/2017 CLINICAL DATA:  Congestion and shortness of breath for 1 week EXAM: PORTABLE CHEST 1 VIEW COMPARISON:  05/01/2017 FINDINGS: The heart size and mediastinal contours are within normal limits. Both lungs are clear. The visualized skeletal structures are unremarkable. IMPRESSION: No active disease. Electronically Signed   By: Alcide Clever M.D.   On: 06/22/2017 10:33    Microbiology: Recent Results (from the past 240 hour(s))  MRSA PCR Screening     Status: Abnormal   Collection Time: 06/22/17  3:29 PM  Result Value Ref Range Status   MRSA by PCR POSITIVE (A) NEGATIVE Final    Comment:        The GeneXpert MRSA Assay (FDA approved for NASAL specimens only), is one component of a comprehensive MRSA colonization surveillance program. It is not intended to diagnose MRSA infection nor to guide or monitor treatment for MRSA infections. RESULT CALLED TO, READ BACK BY AND VERIFIED WITH:  GAMMONS,S @ 0128 ON 06/23/17 BY JUW      Labs: Basic Metabolic Panel:  Recent Labs Lab 06/22/17 0953 06/23/17 0431  NA 135 137  K 3.2* 3.1*  CL 97* 99*  CO2 28 30  GLUCOSE 156* 108*  BUN 14 15  CREATININE 1.05 1.13  CALCIUM 9.4 8.7*   Liver Function Tests:  Recent Labs Lab 06/22/17 0953 06/23/17 0431  AST 25 19  ALT 21 18  ALKPHOS 76 66  BILITOT 0.6 0.5  PROT 7.9 7.3  ALBUMIN 4.4 3.9    Recent Labs Lab 06/22/17 0953  LIPASE 32   No results for input(s): AMMONIA in the last 168 hours. CBC:  Recent Labs Lab 06/22/17 0953 06/23/17 0431  WBC 8.7 7.7  NEUTROABS 7.4  --   HGB 11.2* 10.9*  HCT 36.3* 34.8*  MCV 73.2* 73.3*  PLT 443* 441*   Cardiac Enzymes: No results for input(s): CKTOTAL, CKMB, CKMBINDEX, TROPONINI in the last 168 hours. BNP: BNP (last 3 results) No results for input(s): BNP in the last 8760 hours.  ProBNP (last 3 results) No results for input(s): PROBNP in the last 8760 hours.  CBG: No results for input(s): GLUCAP in the last 168 hours.     SignedChaya Jan  [facility] Pager: [phone] 06/23/2017, 9:37 AM

## 2017-06-23 NOTE — Progress Notes (Signed)
Paged MD about pt's systolic 160s-180s and diastolic in the 100s. Vasotec ordered and given. Pt's BP remained elevated with diastolic 100 or greater. Paged MD and received orders for PRN hydralazine. Will continue to monitor.  Emeline Darling, RN

## 2017-06-27 LAB — ALDOSTERONE + RENIN ACTIVITY W/ RATIO
ALDO / PRA Ratio: 0.3 (ref 0.0–30.0)
ALDOSTERONE: 2.3 ng/dL (ref 0.0–30.0)
PRA LC/MS/MS: 7.201 ng/mL/hr — ABNORMAL HIGH (ref 0.167–5.380)

## 2017-08-29 ENCOUNTER — Emergency Department (HOSPITAL_COMMUNITY): Payer: Self-pay

## 2017-08-29 ENCOUNTER — Observation Stay (HOSPITAL_COMMUNITY)
Admission: EM | Admit: 2017-08-29 | Discharge: 2017-08-30 | Disposition: A | Discharge status: Home / Self Care | Payer: Self-pay | Attending: Internal Medicine | Admitting: Internal Medicine

## 2017-08-29 ENCOUNTER — Other Ambulatory Visit: Payer: Self-pay

## 2017-08-29 ENCOUNTER — Encounter (HOSPITAL_COMMUNITY): Payer: Self-pay | Admitting: *Deleted

## 2017-08-29 DIAGNOSIS — Z79899 Other long term (current) drug therapy: Secondary | ICD-10-CM | POA: Insufficient documentation

## 2017-08-29 DIAGNOSIS — I160 Hypertensive urgency: Principal | ICD-10-CM | POA: Diagnosis present

## 2017-08-29 DIAGNOSIS — G43919 Migraine, unspecified, intractable, without status migrainosus: Secondary | ICD-10-CM | POA: Insufficient documentation

## 2017-08-29 DIAGNOSIS — R9089 Other abnormal findings on diagnostic imaging of central nervous system: Secondary | ICD-10-CM | POA: Diagnosis present

## 2017-08-29 HISTORY — DX: Personal history of other medical treatment: Z92.89

## 2017-08-29 LAB — CBC
HCT: 44.1 % (ref 39.0–52.0)
HEMOGLOBIN: 13.6 g/dL (ref 13.0–17.0)
MCH: 23.1 pg — ABNORMAL LOW (ref 26.0–34.0)
MCHC: 30.8 g/dL (ref 30.0–36.0)
MCV: 74.9 fL — ABNORMAL LOW (ref 78.0–100.0)
PLATELETS: 393 10*3/uL (ref 150–400)
RBC: 5.89 MIL/uL — ABNORMAL HIGH (ref 4.22–5.81)
RDW: 16.4 % — ABNORMAL HIGH (ref 11.5–15.5)
WBC: 10.5 10*3/uL (ref 4.0–10.5)

## 2017-08-29 LAB — COMPREHENSIVE METABOLIC PANEL
ALBUMIN: 4.9 g/dL (ref 3.5–5.0)
ALK PHOS: 102 U/L (ref 38–126)
ALT: 23 U/L (ref 17–63)
ANION GAP: 10 (ref 5–15)
AST: 29 U/L (ref 15–41)
BILIRUBIN TOTAL: 0.6 mg/dL (ref 0.3–1.2)
BUN: 17 mg/dL (ref 6–20)
CALCIUM: 9.5 mg/dL (ref 8.9–10.3)
CO2: 26 mmol/L (ref 22–32)
CREATININE: 1.15 mg/dL (ref 0.61–1.24)
Chloride: 98 mmol/L — ABNORMAL LOW (ref 101–111)
GFR calc non Af Amer: >60 mL/min (ref >60)
GLUCOSE: 147 mg/dL — AB (ref 65–99)
Potassium: 3.4 mmol/L — ABNORMAL LOW (ref 3.5–5.1)
Sodium: 134 mmol/L — ABNORMAL LOW (ref 135–145)
TOTAL PROTEIN: 8.6 g/dL — AB (ref 6.5–8.1)

## 2017-08-29 LAB — TYPE AND SCREEN
ABO/RH(D): B NEG
ANTIBODY SCREEN: NEGATIVE

## 2017-08-29 MED ORDER — CHLORTHALIDONE 25 MG PO TABS
ORAL_TABLET | ORAL | Status: AC
  Filled 2017-08-29: qty 2

## 2017-08-29 MED ORDER — DIPHENHYDRAMINE HCL 50 MG/ML IJ SOLN
25.0000 mg | Freq: Once | INTRAMUSCULAR | Status: AC
  Administered 2017-08-29: 25 mg via INTRAVENOUS
  Filled 2017-08-29: qty 1

## 2017-08-29 MED ORDER — POTASSIUM CHLORIDE CRYS ER 20 MEQ PO TBCR
40.0000 meq | EXTENDED_RELEASE_TABLET | Freq: Once | ORAL | Status: AC
  Administered 2017-08-29: 40 meq via ORAL
  Filled 2017-08-29: qty 2

## 2017-08-29 MED ORDER — SODIUM CHLORIDE 0.9 % IV SOLN: INTRAVENOUS | Status: DC

## 2017-08-29 MED ORDER — METOPROLOL TARTRATE 5 MG/5ML IV SOLN
5.0000 mg | INTRAVENOUS | Status: DC | PRN
  Administered 2017-08-29 – 2017-08-30 (×7): 5 mg via INTRAVENOUS
  Filled 2017-08-29 (×7): qty 5

## 2017-08-29 MED ORDER — DEXAMETHASONE SODIUM PHOSPHATE 4 MG/ML IJ SOLN
10.0000 mg | Freq: Once | INTRAMUSCULAR | Status: AC
  Administered 2017-08-29: 10 mg via INTRAVENOUS
  Filled 2017-08-29: qty 3

## 2017-08-29 MED ORDER — SODIUM CHLORIDE 0.9 % IV BOLUS (SEPSIS)
1000.0000 mL | Freq: Once | INTRAVENOUS | Status: AC
  Administered 2017-08-29: 1000 mL via INTRAVENOUS

## 2017-08-29 MED ORDER — METOCLOPRAMIDE HCL 5 MG/ML IJ SOLN
10.0000 mg | Freq: Once | INTRAMUSCULAR | Status: AC
  Administered 2017-08-29: 10 mg via INTRAVENOUS
  Filled 2017-08-29: qty 2

## 2017-08-29 MED ORDER — CHLORTHALIDONE 50 MG PO TABS
50.0000 mg | ORAL_TABLET | Freq: Once | ORAL | Status: AC
  Administered 2017-08-29: 50 mg via ORAL
  Filled 2017-08-29: qty 1

## 2017-08-29 NOTE — ED Notes (Signed)
EDP notified of last BP, no new orders given-will continue to monitor v/s as meds have time to work

## 2017-08-29 NOTE — ED Triage Notes (Signed)
Pt c/o generalized weakness, sob, nausea that started two days ago, has hx of upper GI bleeding in past. Denies any dark stools or vomiting.

## 2017-08-29 NOTE — ED Provider Notes (Addendum)
[facility] EMERGENCY DEPARTMENT Provider Note   CSN: [id number] Arrival date & time: 08/29/17  1954     History   Chief Complaint Chief Complaint  Patient presents with  . Weakness    HPI Alfred LeydenDustin Avila is a 31 y.o. male.  Patient presents with complaint of high blood pressure.  Also onset of a typical migraine with pain behind his eyes some photophobia and some mild nausea.  Patient also said in triage he has some generalized weakness and some shortness of breath.  Patient was concerned that maybe he had a recurrent GI bleed.  Because he felt like this before but has had no abdominal pain.  No vomiting and no blood in his bowel movements.  Patient was admitted September 21 for hypertensive emergency and encephalopathy.  Review of that admission showed that they felt everything was related to prolonged high blood pressure and poorly treated.  He was discharged on hydrochlorothiazide Lopressor and Norvasc.  However patient has had no follow-up to have any of these medications adjusted since discharge.  [place] the [place].      Past Medical History:  Diagnosis Date  . Chronic back pain   . Essential hypertension   . Gastric ulcer   . History of blood transfusion   . Migraines     Patient Active Problem List   Diagnosis Date Noted  . Abnormal CT of brain 08/30/2017  . Hypertensive urgency 08/29/2017  . Hypertensive emergency 06/22/2017  . Symptomatic anemia 05/01/2017  . Upper GI bleed 05/01/2017  . Elevated troponin 05/01/2017  . Hyperglycemia 05/01/2017  . Essential hypertension 05/01/2017    Past Surgical History:  Procedure Laterality Date  . APPENDECTOMY    . CHOLECYSTECTOMY    . COLONOSCOPY    . ESOPHAGOGASTRODUODENOSCOPY N/A 05/02/2017   Procedure: ESOPHAGOGASTRODUODENOSCOPY (EGD);  Surgeon: Malissa Hippoehman, Najeeb U, MD;  Location: [facility];  Service: Endoscopy;  Laterality: N/A;       Home Medications    Prior to Admission medications     Medication Sig Start Date End Date Taking? Authorizing Provider  ferrous sulfate 325 (65 FE) MG tablet Take 1 tablet (325 mg total) by mouth 2 (two) times daily with a meal. 05/03/17  Yes [name], Peter, MD  hydrochlorothiazide (HYDRODIURIL) 25 MG tablet Take 1 tablet (25 mg total) by mouth daily. 06/24/17  Yes Philip AspenHernandez Acosta, Limmie PatriciaEstela Y, MD  Melatonin 5 MG CAPS Take 5 mg by mouth every evening.   Yes [provider]  metoprolol tartrate (LOPRESSOR) 25 MG tablet Take 1 tablet (25 mg total) by mouth 2 (two) times daily. 06/23/17 06/23/18 Yes Henderson CloudHernandez Acosta, Estela Y, MD  Multiple Vitamin (MULTIVITAMIN WITH MINERALS) TABS tablet Take 1 tablet by mouth daily.   Yes [provider]  Omega-3 Fatty Acids (FISH OIL) 1000 MG CAPS Take 1 capsule by mouth daily.   Yes [provider]  omeprazole (PRILOSEC OTC) 20 MG tablet Take 20 mg by mouth daily.   Yes [provider]  amLODipine (NORVASC) 10 MG tablet Take 1 tablet (10 mg total) by mouth daily. Patient not taking: Reported on 08/29/2017 06/24/17   Philip AspenHernandez Acosta, Limmie PatriciaEstela Y, MD    Family History No family history on file.  Social History Social History   Tobacco Use  . Smoking status: Never Smoker  . Smokeless tobacco: Never Used  . Tobacco comment: cannabinoid oil (CBD oil) - 3-4 times daily  Substance Use Topics  . Alcohol use: Yes  Comment: denies use since prior "ulcer"  . Drug use: No     Allergies   Patient has no known allergies.   Review of Systems Review of Systems  Constitutional: Positive for fatigue. Negative for fever.  HENT: Negative for congestion.   Eyes: Positive for photophobia.  Respiratory: Negative for shortness of breath.   Cardiovascular: Negative for chest pain.  Gastrointestinal: Positive for nausea. Negative for abdominal pain, blood in stool and vomiting.  Genitourinary: Negative for dysuria.  Musculoskeletal: Negative for back pain.  Skin: Negative for rash.   Neurological: Positive for headaches.  Hematological: Does not bruise/bleed easily.  Psychiatric/Behavioral: Negative for confusion.     Physical Exam Updated Vital Signs BP (!) 201/127   Pulse 80   Temp 97.9 F (36.6 C) (Oral)   Resp 15   Ht 1.981 m (6\' 6" )   Wt 111.1 kg (244 lb 14.4 oz)   SpO2 98%   BMI 28.30 kg/m   Physical Exam  Constitutional: He is oriented to person, place, and time. He appears well-developed and well-nourished. No distress.  HENT:  Head: Normocephalic and atraumatic.  Mouth/Throat: Oropharynx is clear and moist.  Eyes: Conjunctivae and EOM are normal. Pupils are equal, round, and reactive to light.  Neck: Normal range of motion. Neck supple.  Cardiovascular: Normal rate and regular rhythm.  Pulmonary/Chest: Effort normal and breath sounds normal. No respiratory distress.  Abdominal: Soft. Bowel sounds are normal. There is no tenderness.  Musculoskeletal: Normal range of motion.  Neurological: He is alert and oriented to person, place, and time. No cranial nerve deficit or sensory deficit. He exhibits normal muscle tone. Coordination normal.  Skin: Skin is warm.  Nursing note and vitals reviewed.    ED Treatments / Results  Labs (all labs ordered are listed, but only abnormal results are displayed) Labs Reviewed  COMPREHENSIVE METABOLIC PANEL - Abnormal; Notable for the following components:      Result Value   Sodium 134 (*)    Potassium 3.4 (*)    Chloride 98 (*)    Glucose, Bld 147 (*)    Total Protein 8.6 (*)    All other components within normal limits  CBC - Abnormal; Notable for the following components:   RBC 5.89 (*)    MCV 74.9 (*)    MCH 23.1 (*)    RDW 16.4 (*)    All other components within normal limits  RAPID URINE DRUG SCREEN, HOSP PERFORMED  TROPONIN I  TROPONIN I  BASIC METABOLIC PANEL  CBC  POC OCCULT BLOOD, ED  TYPE AND SCREEN    EKG  EKG Interpretation None       Radiology Ct Head Wo  Contrast  Result Date: 08/29/2017 CLINICAL DATA:  Severe headache EXAM: CT HEAD WITHOUT CONTRAST TECHNIQUE: Contiguous axial images were obtained from the base of the skull through the vertex without intravenous contrast. COMPARISON:  MRI 06/22/2017, CT brain 06/22/2017, 04/16/2015 FINDINGS: Brain: No acute territorial infarction, hemorrhage, or intracranial mass is visualized. Re- demonstrated scattered foci of hypodensity within the left greater than right white matter, similar distribution as compared with 06/22/2017, improved compared with 2016. Possible hypodensity within the pons. Stable ventricle size. No midline shift Vascular: No hyperdense vessels.  No unexpected calcification. Skull: No fracture or suspicious lesion Sinuses/Orbits: No acute finding. Other: None IMPRESSION: 1. Negative for intracranial hemorrhage or mass. 2. Scattered hypodensities within the left greater than right white matter, grossly similar distribution compared with 06/22/2017. Possible diffuse hypodensity in the  pons, patient was noted to have diffusely abnormal signal in the pons on the 2016 MRI which subsequently resolved, this was thought related to the history of hypertension; MRI correlation would be helpful to assess for recurrent signal abnormality in the pons given history. Electronically Signed   By: Jasmine Pang M.D.   On: 08/29/2017 23:01    Procedures Procedures (including critical care time)  Medications Ordered in ED Medications  hydrALAZINE (APRESOLINE) injection 10 mg (10 mg Intravenous Given 08/30/17 0043)  ferrous sulfate tablet 325 mg (not administered)  Melatonin CAPS 5 mg (not administered)  metoprolol tartrate (LOPRESSOR) tablet 25 mg (not administered)  omeprazole (PRILOSEC OTC) EC tablet 20 mg (not administered)  acetaminophen (TYLENOL) tablet 650 mg (not administered)    Or  acetaminophen (TYLENOL) suppository 650 mg (not administered)  ondansetron (ZOFRAN) tablet 4 mg (not administered)     Or  ondansetron (ZOFRAN) injection 4 mg (not administered)  sodium chloride 0.9 % bolus 1,000 mL (0 mLs Intravenous Stopped 08/29/17 2227)  dexamethasone (DECADRON) injection 10 mg (10 mg Intravenous Given 08/29/17 2119)  diphenhydrAMINE (BENADRYL) injection 25 mg (25 mg Intravenous Given 08/29/17 2116)  metoCLOPramide (REGLAN) injection 10 mg (10 mg Intravenous Given 08/29/17 2118)  chlorthalidone (HYGROTON) tablet 50 mg (50 mg Oral Given 08/29/17 2320)  potassium chloride SA (K-DUR,KLOR-CON) CR tablet 40 mEq (40 mEq Oral Given 08/29/17 2258)     Initial Impression / Assessment and Plan / ED Course  I have reviewed the triage vital signs and the nursing notes.  Pertinent labs & imaging results that were available during my care of the patient were reviewed by me and considered in my medical decision making (see chart for details).    Patient's blood pressure was markedly elevated.  Initial thoughts it may be it was up as high is it was due to the fact that he had the migraine.  We were getting systolics as high as 250.  Patient treated for migraine with migraine cocktail Reglan and Decadron Benadryl IV fluids.  Headache improved significantly but blood pressure did not.  Patient given 3 doses of 5 mg of IV Lopressor.  Blood pressure eventually improved to a systolic of 201.  Patient without any acute mental status changes or any focal neuro deficits.  CT head was ordered just for completeness sake discussed with hospitalist admitting team and they will admit for hypertensive urgency.  Patient will need to have his blood pressure better controlled.  Probably have his outpatient meds adjusted.   Final Clinical Impressions(s) / ED Diagnoses   Final diagnoses:  Hypertensive urgency  Intractable migraine without status migrainosus, unspecified migraine type    ED Discharge Orders    None       Vanetta Mulders, MD 08/30/17 1610    Vanetta Mulders, MD 08/30/17 [phone]

## 2017-08-29 NOTE — ED Notes (Signed)
POC occult blood NEGATIVE 

## 2017-08-29 NOTE — ED Notes (Signed)
Blood pressure taken in right arm with manual cuff.Patient states that he has a really bad migraine and he had a nose bleed earlier

## 2017-08-30 ENCOUNTER — Encounter (HOSPITAL_COMMUNITY): Payer: Self-pay | Admitting: *Deleted

## 2017-08-30 ENCOUNTER — Other Ambulatory Visit: Payer: Self-pay

## 2017-08-30 DIAGNOSIS — I160 Hypertensive urgency: Secondary | ICD-10-CM

## 2017-08-30 DIAGNOSIS — R9089 Other abnormal findings on diagnostic imaging of central nervous system: Secondary | ICD-10-CM

## 2017-08-30 LAB — BASIC METABOLIC PANEL
ANION GAP: 7 (ref 5–15)
BUN: 16 mg/dL (ref 6–20)
CALCIUM: 9.5 mg/dL (ref 8.9–10.3)
CO2: 23 mmol/L (ref 22–32)
Chloride: 104 mmol/L (ref 101–111)
Creatinine, Ser: 1.11 mg/dL (ref 0.61–1.24)
GFR calc Af Amer: >60 mL/min (ref >60)
Glucose, Bld: 175 mg/dL — ABNORMAL HIGH (ref 65–99)
POTASSIUM: 3.7 mmol/L (ref 3.5–5.1)
SODIUM: 134 mmol/L — AB (ref 135–145)

## 2017-08-30 LAB — RAPID URINE DRUG SCREEN, HOSP PERFORMED
Amphetamines: NOT DETECTED
BARBITURATES: NOT DETECTED
BENZODIAZEPINES: NOT DETECTED
COCAINE: NOT DETECTED
OPIATES: NOT DETECTED
TETRAHYDROCANNABINOL: NOT DETECTED

## 2017-08-30 LAB — TROPONIN I
TROPONIN I: 0.06 ng/mL — AB (ref <0.03)
Troponin I: 0.06 ng/mL (ref <0.03)

## 2017-08-30 LAB — CBC
HEMATOCRIT: 42.7 % (ref 39.0–52.0)
HEMOGLOBIN: 13.5 g/dL (ref 13.0–17.0)
MCH: 22.7 pg — ABNORMAL LOW (ref 26.0–34.0)
MCHC: 31.6 g/dL (ref 30.0–36.0)
MCV: 71.9 fL — ABNORMAL LOW (ref 78.0–100.0)
Platelets: 490 10*3/uL — ABNORMAL HIGH (ref 150–400)
RBC: 5.94 MIL/uL — ABNORMAL HIGH (ref 4.22–5.81)
RDW: 18.5 % — ABNORMAL HIGH (ref 11.5–15.5)
WBC: 10.4 10*3/uL (ref 4.0–10.5)

## 2017-08-30 LAB — MRSA PCR SCREENING: MRSA BY PCR: POSITIVE — AB

## 2017-08-30 MED ORDER — LISINOPRIL 10 MG PO TABS
20.0000 mg | ORAL_TABLET | Freq: Every day | ORAL | Status: DC
  Administered 2017-08-30: 20 mg via ORAL
  Filled 2017-08-30: qty 2

## 2017-08-30 MED ORDER — HYDRALAZINE HCL 20 MG/ML IJ SOLN
INTRAMUSCULAR | Status: AC
  Administered 2017-08-30: 10 mg via INTRAVENOUS
  Filled 2017-08-30: qty 1

## 2017-08-30 MED ORDER — MELATONIN 5 MG PO CAPS: 5.0000 mg | ORAL_CAPSULE | Freq: Every evening | ORAL | Status: DC

## 2017-08-30 MED ORDER — ONDANSETRON HCL 4 MG/2ML IJ SOLN: 4.0000 mg | Freq: Four times a day (QID) | INTRAMUSCULAR | Status: DC | PRN

## 2017-08-30 MED ORDER — HYDRALAZINE HCL 20 MG/ML IJ SOLN
10.0000 mg | INTRAMUSCULAR | Status: AC
  Administered 2017-08-30: 10 mg via INTRAVENOUS

## 2017-08-30 MED ORDER — ACETAMINOPHEN 325 MG PO TABS: 650.0000 mg | ORAL_TABLET | Freq: Four times a day (QID) | ORAL | Status: DC | PRN

## 2017-08-30 MED ORDER — ONDANSETRON HCL 4 MG PO TABS
4.0000 mg | ORAL_TABLET | Freq: Four times a day (QID) | ORAL | Status: DC | PRN
Start: 2017-08-30 — End: 2017-08-30
  Administered 2017-08-30: 4 mg via ORAL
  Filled 2017-08-30: qty 1

## 2017-08-30 MED ORDER — CHLORHEXIDINE GLUCONATE CLOTH 2 % EX PADS
6.0000 | MEDICATED_PAD | Freq: Every day | CUTANEOUS | Status: DC
  Administered 2017-08-30: 6 via TOPICAL

## 2017-08-30 MED ORDER — PANTOPRAZOLE SODIUM 40 MG PO TBEC
40.0000 mg | DELAYED_RELEASE_TABLET | Freq: Every day | ORAL | Status: DC
  Administered 2017-08-30: 40 mg via ORAL
  Filled 2017-08-30: qty 1

## 2017-08-30 MED ORDER — FERROUS SULFATE 325 (65 FE) MG PO TABS
325.0000 mg | ORAL_TABLET | Freq: Two times a day (BID) | ORAL | Status: DC
  Administered 2017-08-30: 325 mg via ORAL
  Filled 2017-08-30: qty 1

## 2017-08-30 MED ORDER — MUPIROCIN 2 % EX OINT
1.0000 "application " | TOPICAL_OINTMENT | Freq: Two times a day (BID) | CUTANEOUS | Status: DC
  Administered 2017-08-30: 1 via NASAL
  Filled 2017-08-30: qty 22

## 2017-08-30 MED ORDER — METOPROLOL TARTRATE 25 MG PO TABS
25.0000 mg | ORAL_TABLET | Freq: Two times a day (BID) | ORAL | Status: DC
  Administered 2017-08-30: 25 mg via ORAL
  Filled 2017-08-30: qty 1

## 2017-08-30 MED ORDER — AMLODIPINE BESYLATE 5 MG PO TABS
10.0000 mg | ORAL_TABLET | Freq: Every day | ORAL | Status: DC
  Filled 2017-08-30: qty 2

## 2017-08-30 MED ORDER — ACETAMINOPHEN 650 MG RE SUPP
650.0000 mg | Freq: Four times a day (QID) | RECTAL | Status: DC | PRN
Start: 2017-08-30 — End: 2017-08-30

## 2017-08-30 MED ORDER — METOPROLOL TARTRATE 25 MG PO TABS: 25.0000 mg | ORAL_TABLET | Freq: Two times a day (BID) | ORAL | 2 refills | Status: DC

## 2017-08-30 MED ORDER — LISINOPRIL 20 MG PO TABS: 20.0000 mg | ORAL_TABLET | Freq: Every day | ORAL | 1 refills | Status: DC

## 2017-08-30 MED ORDER — HYDRALAZINE HCL 20 MG/ML IJ SOLN
10.0000 mg | Freq: Three times a day (TID) | INTRAMUSCULAR | Status: DC | PRN
  Administered 2017-08-30 (×2): 10 mg via INTRAVENOUS
  Filled 2017-08-30 (×3): qty 1

## 2017-08-30 MED ORDER — PANTOPRAZOLE SODIUM 20 MG PO TBEC
20.0000 mg | DELAYED_RELEASE_TABLET | Freq: Every day | ORAL | Status: DC
  Filled 2017-08-30 (×2): qty 1

## 2017-08-30 NOTE — ED Notes (Signed)
Hospitalist paged, advised this nurse called to give report and was told by dept 300 nurse  the pt's Bps are too high for their unit and [facility] dr reports no one has contacted him regarding placing the pt in stepdown as opposed to dept 300 but he will admit the pt to stepdown

## 2017-08-30 NOTE — Discharge Summary (Signed)
Physician Discharge Summary  Pamalee LeydenDustin Cavanagh LKG:[id number] DOB: 07/08/1986 DOA: 08/29/2017  PCP: Patient, No Pcp Per  Admit date: 08/29/2017 Discharge date: 08/30/2017  Admitted From: Home Disposition: Home  Recommendations for Outpatient Follow-up:  1. Follow up with PCP in 1-2 weeks 2. Please obtain BMP/CBC in one week  Discharge Condition: Stable CODE STATUS: Full code Diet recommendation: Heart Healthy   Brief/Interim Summary: 31 year old male with a history of hypertension, presented to the hospital with complaints of headache.  He was found to have hypertensive urgency and was admitted to the hospital for further management.  He reports running out of his medications over the last few days.  Chronically on metoprolol and hydrochlorothiazide.  He was previously prescribed amlodipine but I do not taken this in quite some time since he was unable to afford it.  Patient was restarted on his home dose of metoprolol.  Lisinopril was also added to his medication regimen.  Overall blood pressures have improved.  He will need further titration of his medications as an outpatient.  He is feeling better.  Regarding his headache, his blood pressure was likely contributing to this.  He did receive treatment for migraines in the emergency room and this also helped his symptoms.  He has been advised to avoid NSAIDs due to his history of GI upset in the past.  He reports that he will use Tylenol as needed.  He was noted to have an abnormal CT scan of the brain.  This showed scattered hypodensities within the left greater than right white matter.  This was recently evaluated on 06/22/17 with MRI brain that showed no acute or reversible finding.  It was felt that his abnormal findings were likely related to persistently elevated blood pressures.  Patient was offered repeat MRI to further evaluate recurrent findings on CT head done this admission.  Patient was very insistent on leaving the hospital today.   Since clinically he is feeling better and blood pressures are better controlled, I have advised him to consider outpatient follow-up for MRI brain.  This will need to be arranged with a primary care physician.  He would also need follow-up with her primary care physician for long-term management of his blood pressure.  Patient is otherwise feeling better and feels ready for discharge.  Discharge Diagnoses:  Principal Problem:   Hypertensive urgency Active Problems:   Abnormal CT of brain    Discharge Instructions  Discharge Instructions    Diet - low sodium heart healthy   Complete by:  As directed    Increase activity slowly   Complete by:  As directed      Allergies as of 08/30/2017   No Known Allergies     Medication List    STOP taking these medications   amLODipine 10 MG tablet Commonly known as:  NORVASC   hydrochlorothiazide 25 MG tablet Commonly known as:  HYDRODIURIL     TAKE these medications   ferrous sulfate 325 (65 FE) MG tablet Take 1 tablet (325 mg total) by mouth 2 (two) times daily with a meal.   Fish Oil 1000 MG Caps Take 1 capsule by mouth daily.   lisinopril 20 MG tablet Commonly known as:  PRINIVIL,ZESTRIL Take 1 tablet (20 mg total) by mouth daily. Start taking on:  08/31/2017   Melatonin 5 MG Caps Take 5 mg by mouth every evening.   metoprolol tartrate 25 MG tablet Commonly known as:  LOPRESSOR Take 1 tablet (25 mg total) by mouth 2 (two)  times daily.   multivitamin with minerals Tabs tablet Take 1 tablet by mouth daily.   omeprazole 20 MG tablet Commonly known as:  PRILOSEC OTC Take 20 mg by mouth daily.       No Known Allergies  Consultations:     Procedures/Studies: Ct Head Wo Contrast  Result Date: 08/29/2017 CLINICAL DATA:  Severe headache EXAM: CT HEAD WITHOUT CONTRAST TECHNIQUE: Contiguous axial images were obtained from the base of the skull through the vertex without intravenous contrast. COMPARISON:  MRI  06/22/2017, CT brain 06/22/2017, 04/16/2015 FINDINGS: Brain: No acute territorial infarction, hemorrhage, or intracranial mass is visualized. Re- demonstrated scattered foci of hypodensity within the left greater than right white matter, similar distribution as compared with 06/22/2017, improved compared with 2016. Possible hypodensity within the pons. Stable ventricle size. No midline shift Vascular: No hyperdense vessels.  No unexpected calcification. Skull: No fracture or suspicious lesion Sinuses/Orbits: No acute finding. Other: None IMPRESSION: 1. Negative for intracranial hemorrhage or mass. 2. Scattered hypodensities within the left greater than right white matter, grossly similar distribution compared with 06/22/2017. Possible diffuse hypodensity in the pons, patient was noted to have diffusely abnormal signal in the pons on the 2016 MRI which subsequently resolved, this was thought related to the history of hypertension; MRI correlation would be helpful to assess for recurrent signal abnormality in the pons given history. Electronically Signed   By: Jasmine PangKim  Fujinaga M.D.   On: 08/29/2017 23:01       Subjective: Feeling better.  Headache has improved.  Feels generally weak.  Discharge Exam: Vitals:   08/30/17 1200 08/30/17 1217  BP: (!) 174/127   Pulse: (!) 106   Resp: 14   Temp:  99.6 F (37.6 C)  SpO2:     Vitals:   08/30/17 1100 08/30/17 1105 08/30/17 1200 08/30/17 1217  BP: (!) 163/117 (!) 163/117 (!) 174/127   Pulse: 99  (!) 106   Resp: 13  14   Temp:    99.6 F (37.6 C)  TempSrc:    Axillary  SpO2:      Weight:      Height:        General: Pt is alert, awake, not in acute distress Cardiovascular: RRR, S1/S2 +, no rubs, no gallops Respiratory: CTA bilaterally, no wheezing, no rhonchi Abdominal: Soft, NT, ND, bowel sounds + Extremities: no edema, no cyanosis    The results of significant diagnostics from this hospitalization (including imaging, microbiology, ancillary  and laboratory) are listed below for reference.     Microbiology: Recent Results (from the past 240 hour(s))  MRSA PCR Screening     Status: Abnormal   Collection Time: 08/30/17  1:45 AM  Result Value Ref Range Status   MRSA by PCR POSITIVE (A) NEGATIVE Final    Comment: RESULT CALLED TO, READ BACK BY AND VERIFIED WITH: Sharol RousselWAGNOR,R [id number] BY MATTHEWS, B 11.29.18        The GeneXpert MRSA Assay (FDA approved for NASAL specimens only), is one component of a comprehensive MRSA colonization surveillance program. It is not intended to diagnose MRSA infection nor to guide or monitor treatment for MRSA infections.      Labs: BNP (last 3 results) No results for input(s): BNP in the last 8760 hours. Basic Metabolic Panel: Recent Labs  Lab 08/29/17 2102 08/30/17 0432  NA 134* 134*  K 3.4* 3.7  CL 98* 104  CO2 26 23  GLUCOSE 147* 175*  BUN 17 16  CREATININE 1.15 1.11  CALCIUM  9.5 9.5   Liver Function Tests: Recent Labs  Lab 08/29/17 2102  AST 29  ALT 23  ALKPHOS 102  BILITOT 0.6  PROT 8.6*  ALBUMIN 4.9   No results for input(s): LIPASE, AMYLASE in the last 168 hours. No results for input(s): AMMONIA in the last 168 hours. CBC: Recent Labs  Lab 08/29/17 2102 08/30/17 0432  WBC 10.5 10.4  HGB 13.6 13.5  HCT 44.1 42.7  MCV 74.9* 71.9*  PLT 393 490*   Cardiac Enzymes: Recent Labs  Lab 08/30/17 0100 08/30/17 0432  TROPONINI 0.06* 0.06*   BNP: Invalid input(s): POCBNP CBG: No results for input(s): GLUCAP in the last 168 hours. D-Dimer No results for input(s): DDIMER in the last 72 hours. Hgb A1c No results for input(s): HGBA1C in the last 72 hours. Lipid Profile No results for input(s): CHOL, HDL, LDLCALC, TRIG, CHOLHDL, LDLDIRECT in the last 72 hours. Thyroid function studies No results for input(s): TSH, T4TOTAL, T3FREE, THYROIDAB in the last 72 hours.  Invalid input(s): FREET3 Anemia work up No results for input(s): VITAMINB12, FOLATE, FERRITIN,  TIBC, IRON, RETICCTPCT in the last 72 hours. Urinalysis No results found for: COLORURINE, APPEARANCEUR, LABSPEC, PHURINE, GLUCOSEU, HGBUR, BILIRUBINUR, KETONESUR, PROTEINUR, UROBILINOGEN, NITRITE, LEUKOCYTESUR Sepsis Labs Invalid input(s): PROCALCITONIN,  WBC,  LACTICIDVEN Microbiology Recent Results (from the past 240 hour(s))  MRSA PCR Screening     Status: Abnormal   Collection Time: 08/30/17  1:45 AM  Result Value Ref Range Status   MRSA by PCR POSITIVE (A) NEGATIVE Final    Comment: RESULT CALLED TO, READ BACK BY AND VERIFIED WITH: Sharol Roussel [id number] BY MATTHEWS, B 11.29.18        The GeneXpert MRSA Assay (FDA approved for NASAL specimens only), is one component of a comprehensive MRSA colonization surveillance program. It is not intended to diagnose MRSA infection nor to guide or monitor treatment for MRSA infections.      Time coordinating discharge: Over 30 minutes  SIGNED:   Erick Blinks, MD  [facility] 08/30/2017, 8:28 PM Pager   If 7PM-7AM, please contact night-coverage [email or web address] Password [email or web address]

## 2017-08-30 NOTE — ED Notes (Signed)
[facility] RN ICU

## 2017-08-30 NOTE — ED Notes (Signed)
Called to give report, RN will call back-reviewing the chart

## 2017-08-30 NOTE — H&P (Addendum)
[facility] History and Physical  Pamalee LeydenDustin Bither ZOX:[id number] DOB: 07/05/1986 DOA: 08/29/2017  Referring physician:  PCP: Patient, No Pcp Per   Chief Complaint: "I was worried I had a bleed."  HPI: Pamalee LeydenDustin Notch is a 31 y.o. male 31 year old male past medical history significant for gastric ulcer, migraines and hypertension presents emergency room with a chief complaint of feeling out of sorts.  Patient has a headache that is rated as severe.  Worse than his typical migraine.  Tried his normal treatment which was ineffective.  Patient states this made him concerned that he may have a GI bleed.  No hematochezia or melena.  No hematemesis.  Does take iron regularly.  Came to emergency room for evaluation.  Patient is on medication for his blood pressure but skipped his regular dose on the 28th because it makes him go to the bathroom too much.  [facility] his next visit is in January.  ED course: Treatment of migraine with migraine cocktail effective but blood pressure did not come down.  Patient also given antihypertensive.  Blood pressure still extremely elevated.  CT head done that was negative.  Hospitalist consulted for admission for hypertensive urgency.  No FMHx of stroke.  Denies drug use. Uses CBD OTC.  Review of Systems:  As per HPI otherwise 10 point review of systems negative.    Past Medical History:  Diagnosis Date  . Chronic back pain   . Essential hypertension   . Gastric ulcer   . History of blood transfusion   . Migraines    Past Surgical History:  Procedure Laterality Date  . APPENDECTOMY    . CHOLECYSTECTOMY    . COLONOSCOPY    . ESOPHAGOGASTRODUODENOSCOPY N/A 05/02/2017   Procedure: ESOPHAGOGASTRODUODENOSCOPY (EGD);  Surgeon: Malissa Hippoehman, Najeeb U, MD;  Location: [facility];  Service: Endoscopy;  Laterality: N/A;   Social History:  reports that  has never smoked. he has never used smokeless tobacco. He reports  that he drinks alcohol. He reports that he does not use drugs.  No Known Allergies  No family history on file.   Prior to Admission medications   Medication Sig Start Date End Date Taking? Authorizing Provider  ferrous sulfate 325 (65 FE) MG tablet Take 1 tablet (325 mg total) by mouth 2 (two) times daily with a meal. 05/03/17  Yes [name], Peter, MD  hydrochlorothiazide (HYDRODIURIL) 25 MG tablet Take 1 tablet (25 mg total) by mouth daily. 06/24/17  Yes Philip AspenHernandez Acosta, Limmie PatriciaEstela Y, MD  Melatonin 5 MG CAPS Take 5 mg by mouth every evening.   Yes [provider]  metoprolol tartrate (LOPRESSOR) 25 MG tablet Take 1 tablet (25 mg total) by mouth 2 (two) times daily. 06/23/17 06/23/18 Yes Henderson CloudHernandez Acosta, Estela Y, MD  Multiple Vitamin (MULTIVITAMIN WITH MINERALS) TABS tablet Take 1 tablet by mouth daily.   Yes [provider]  Omega-3 Fatty Acids (FISH OIL) 1000 MG CAPS Take 1 capsule by mouth daily.   Yes [provider]  omeprazole (PRILOSEC OTC) 20 MG tablet Take 20 mg by mouth daily.   Yes [provider]  amLODipine (NORVASC) 10 MG tablet Take 1 tablet (10 mg total) by mouth daily. Patient not taking: Reported on 08/29/2017 06/24/17   Philip AspenHernandez Acosta, Limmie PatriciaEstela Y, MD   Physical Exam: Vitals:   08/30/17 0005 08/30/17 0015 08/30/17 0020 08/30/17 0025  BP: (!) 230/155 (!) 232/151 (!) 216/154 (!) 231/149  Pulse: 86 84 82  84  Resp: 16 13 16 17   Temp:      TempSrc:      SpO2: 97% 96% 96% 97%  Weight:      Height:        Wt Readings from Last 3 Encounters:  08/29/17 111.1 kg (244 lb 14.4 oz)  06/23/17 107.3 kg (236 lb 8.9 oz)  05/01/17 99.8 kg (220 lb)    General:  Appears calm and comfortable; A&Ox3 Eyes:  PERRL, EOMI, normal lids, iris ENT:  grossly normal hearing, lips & tongue Neck:  no LAD, masses or thyromegaly Cardiovascular:  RRR, no m/r/g. No LE edema.  Respiratory:  CTA bilaterally, no w/r/r. Normal respiratory effort. Abdomen:  soft, ntnd Skin:   no rash or induration seen on limited exam Musculoskeletal:  grossly normal tone BUE/BLE Psychiatric:  grossly normal mood and affect, speech fluent and appropriate Neurologic:  CN 2-12 grossly intact, moves all extremities in coordinated fashion.          Labs on Admission:  Basic Metabolic Panel: Recent Labs  Lab 08/29/17 2102  NA 134*  K 3.4*  CL 98*  CO2 26  GLUCOSE 147*  BUN 17  CREATININE 1.15  CALCIUM 9.5   Liver Function Tests: Recent Labs  Lab 08/29/17 2102  AST 29  ALT 23  ALKPHOS 102  BILITOT 0.6  PROT 8.6*  ALBUMIN 4.9   No results for input(s): LIPASE, AMYLASE in the last 168 hours. No results for input(s): AMMONIA in the last 168 hours. CBC: Recent Labs  Lab 08/29/17 2102  WBC 10.5  HGB 13.6  HCT 44.1  MCV 74.9*  PLT 393   Cardiac Enzymes: No results for input(s): CKTOTAL, CKMB, CKMBINDEX, TROPONINI in the last 168 hours.  BNP (last 3 results) No results for input(s): BNP in the last 8760 hours.  ProBNP (last 3 results) No results for input(s): PROBNP in the last 8760 hours.   Serum creatinine: 1.15 mg/dL 16/10/96 0454 Estimated creatinine clearance: 131.9 mL/min  CBG: No results for input(s): GLUCAP in the last 168 hours.  Radiological Exams on Admission: Ct Head Wo Contrast  Result Date: 08/29/2017 CLINICAL DATA:  Severe headache EXAM: CT HEAD WITHOUT CONTRAST TECHNIQUE: Contiguous axial images were obtained from the base of the skull through the vertex without intravenous contrast. COMPARISON:  MRI 06/22/2017, CT brain 06/22/2017, 04/16/2015 FINDINGS: Brain: No acute territorial infarction, hemorrhage, or intracranial mass is visualized. Re- demonstrated scattered foci of hypodensity within the left greater than right white matter, similar distribution as compared with 06/22/2017, improved compared with 2016. Possible hypodensity within the pons. Stable ventricle size. No midline shift Vascular: No hyperdense vessels.  No unexpected  calcification. Skull: No fracture or suspicious lesion Sinuses/Orbits: No acute finding. Other: None IMPRESSION: 1. Negative for intracranial hemorrhage or mass. 2. Scattered hypodensities within the left greater than right white matter, grossly similar distribution compared with 06/22/2017. Possible diffuse hypodensity in the pons, patient was noted to have diffusely abnormal signal in the pons on the 2016 MRI which subsequently resolved, this was thought related to the history of hypertension; MRI correlation would be helpful to assess for recurrent signal abnormality in the pons given history. Electronically Signed   By: Jasmine Pang M.D.   On: 08/29/2017 23:01    EKG: ordered  Assessment/Plan Principal Problem:   Hypertensive urgency Active Problems:   Abnormal CT of brain   Hypertensive urgency Patient's primary symptom is headache but CT head negative.  No neurological symptoms Headache was resolved  with migraine cocktail.  At Likely due to medication noncompliance. We will check a urine drug screen PRN hydralazine Was given chlorthalidone in the ED as well as metoprolol May need to be put on IV drip We will monitor Check trop and EKG Serial Trops  Abnormal CT of the brain Patient has a history of abnormalities Will need MRI follow-up as recommended by radiology  Hx of ulcer Cont PPI and Iron suppl  Code Status: FC DVT Prophylaxis: SCDs Family Communication: close friends at bedside Disposition Plan: Pending Improvement  Status: obs sdu  Haydee SalterPhillip M Hobbs, MD Family Medicine [facility] [email or web address] Password [email or web address]

## 2017-08-30 NOTE — Progress Notes (Signed)
MD notified of BP remaining high. Current BP is 210/116.

## 2017-08-30 NOTE — ED Notes (Signed)
Called unit 300 to give report, advised by nurse the pt BP's are too high for the unit [facility]

## 2017-08-30 NOTE — Progress Notes (Signed)
Critical trop of 0.06 called by lab. MD notified via text page

## 2017-08-30 NOTE — Care Management (Addendum)
CM discussed medications with patient. He has been prescribed metoprolol and lisinopril and scripts have been sent to [facility]. These meds are on the $4 dollar list and patient can afford this. He has met with Development worker, community and she will work with him on applying for Medicaid.

## 2017-08-30 NOTE — Clinical Social Work Note (Signed)
Received SW consult stating pt sometimes cannot afford his medications. Discussed with RN CM who will follow up with pt to assist with this issue. It appears pt may be Medicaid eligible and that financial counselor will follow up with him regarding this potential. LCSW will be available if needed.

## 2017-08-31 LAB — OCCULT BLOOD, POC DEVICE: FECAL OCCULT BLD: NEGATIVE

## 2018-03-03 ENCOUNTER — Encounter (HOSPITAL_COMMUNITY): Payer: Self-pay

## 2018-03-03 ENCOUNTER — Emergency Department (HOSPITAL_COMMUNITY)
Admission: EM | Admit: 2018-03-03 | Discharge: 2018-03-03 | Discharge status: Against Medical Advice | Payer: Self-pay | Attending: Emergency Medicine | Admitting: Emergency Medicine

## 2018-03-03 ENCOUNTER — Other Ambulatory Visit: Payer: Self-pay

## 2018-03-03 ENCOUNTER — Emergency Department (HOSPITAL_COMMUNITY): Payer: Self-pay

## 2018-03-03 DIAGNOSIS — I160 Hypertensive urgency: Secondary | ICD-10-CM | POA: Insufficient documentation

## 2018-03-03 DIAGNOSIS — Z9049 Acquired absence of other specified parts of digestive tract: Secondary | ICD-10-CM | POA: Insufficient documentation

## 2018-03-03 DIAGNOSIS — I10 Essential (primary) hypertension: Secondary | ICD-10-CM | POA: Insufficient documentation

## 2018-03-03 DIAGNOSIS — Z79899 Other long term (current) drug therapy: Secondary | ICD-10-CM | POA: Insufficient documentation

## 2018-03-03 DIAGNOSIS — G43001 Migraine without aura, not intractable, with status migrainosus: Secondary | ICD-10-CM | POA: Insufficient documentation

## 2018-03-03 LAB — BASIC METABOLIC PANEL
ANION GAP: 10 (ref 5–15)
BUN: 18 mg/dL (ref 6–20)
CHLORIDE: 99 mmol/L — AB (ref 101–111)
CO2: 26 mmol/L (ref 22–32)
Calcium: 8.7 mg/dL — ABNORMAL LOW (ref 8.9–10.3)
Creatinine, Ser: 1.35 mg/dL — ABNORMAL HIGH (ref 0.61–1.24)
GFR calc Af Amer: >60 mL/min (ref >60)
GFR calc non Af Amer: >60 mL/min (ref >60)
GLUCOSE: 98 mg/dL (ref 65–99)
POTASSIUM: 3.9 mmol/L (ref 3.5–5.1)
Sodium: 135 mmol/L (ref 135–145)

## 2018-03-03 LAB — CBC
HEMATOCRIT: 37.8 % — AB (ref 39.0–52.0)
Hemoglobin: 11.8 g/dL — ABNORMAL LOW (ref 13.0–17.0)
MCH: 24.2 pg — ABNORMAL LOW (ref 26.0–34.0)
MCHC: 31.2 g/dL (ref 30.0–36.0)
MCV: 77.5 fL — AB (ref 78.0–100.0)
Platelets: 387 10*3/uL (ref 150–400)
RBC: 4.88 MIL/uL (ref 4.22–5.81)
RDW: 14.3 % (ref 11.5–15.5)
WBC: 7.1 10*3/uL (ref 4.0–10.5)

## 2018-03-03 MED ORDER — HYDRALAZINE HCL 25 MG PO TABS: 25.0000 mg | ORAL_TABLET | Freq: Three times a day (TID) | ORAL | 0 refills | Status: DC

## 2018-03-03 MED ORDER — ACETAMINOPHEN 325 MG PO TABS: 650.0000 mg | ORAL_TABLET | ORAL | Status: DC | PRN

## 2018-03-03 MED ORDER — HYDRALAZINE HCL 20 MG/ML IJ SOLN
20.0000 mg | Freq: Once | INTRAMUSCULAR | Status: AC
  Administered 2018-03-03: 20 mg via INTRAVENOUS
  Filled 2018-03-03: qty 1

## 2018-03-03 MED ORDER — METOCLOPRAMIDE HCL 5 MG/ML IJ SOLN
10.0000 mg | Freq: Once | INTRAMUSCULAR | Status: AC
  Administered 2018-03-03: 10 mg via INTRAVENOUS
  Filled 2018-03-03: qty 2

## 2018-03-03 MED ORDER — METOPROLOL TARTRATE 5 MG/5ML IV SOLN
5.0000 mg | Freq: Once | INTRAVENOUS | Status: AC
  Administered 2018-03-03: 5 mg via INTRAVENOUS
  Filled 2018-03-03: qty 5

## 2018-03-03 MED ORDER — DIPHENHYDRAMINE HCL 50 MG/ML IJ SOLN
50.0000 mg | Freq: Once | INTRAMUSCULAR | Status: AC
  Administered 2018-03-03: 50 mg via INTRAVENOUS
  Filled 2018-03-03: qty 1

## 2018-03-03 MED ORDER — LISINOPRIL 10 MG PO TABS
10.0000 mg | ORAL_TABLET | Freq: Once | ORAL | Status: AC
  Administered 2018-03-03: 10 mg via ORAL
  Filled 2018-03-03: qty 1

## 2018-03-03 MED ORDER — HYDRALAZINE HCL 25 MG PO TABS
25.0000 mg | ORAL_TABLET | Freq: Once | ORAL | Status: AC
  Administered 2018-03-03: 25 mg via ORAL
  Filled 2018-03-03: qty 1

## 2018-03-03 MED ORDER — METOPROLOL TARTRATE 25 MG PO TABS: 25.0000 mg | ORAL_TABLET | Freq: Two times a day (BID) | ORAL | 0 refills | Status: DC

## 2018-03-03 NOTE — ED Triage Notes (Signed)
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 Pt recorded 260 at home. States he has been doubling his BP medication due to stress last week and he ran out of BP medication last night. Denies NV, denies blurred vision. States that he has been dizzy.

## 2018-03-03 NOTE — ED Notes (Addendum)
Patient states "I don't have a headache anymore and I know my BP is still high, but if I stay in the hospital it's going to stay high. I feel fine and I want to go home". States he doesn't want the CT scan.

## 2018-03-03 NOTE — ED Provider Notes (Signed)
[facility] EMERGENCY DEPARTMENT Provider Note   CSN: [id number] Arrival date & time: 03/03/18  1823     History   Chief Complaint Chief Complaint  Patient presents with  . Hypertension  . Migraine    HPI Alfred Avila is a 32 y.o. male.  Patient presents with history of high blood pressure and migraines, ran out of his blood pressure medicines yesterday supposed to take metoprolol, hydralazine, lisinopril. Patient's had gradual onset headache similar to previous. Patient denies neurologic symptoms. Patient denies chest pressures of breath.      Past Medical History:  Diagnosis Date  . Chronic back pain   . Essential hypertension   . Gastric ulcer   . History of blood transfusion   . Migraines     Patient Active Problem List   Diagnosis Date Noted  . Abnormal CT of brain 08/30/2017  . Hypertensive urgency 08/29/2017  . Hypertensive emergency 06/22/2017  . Symptomatic anemia 05/01/2017  . Upper GI bleed 05/01/2017  . Elevated troponin 05/01/2017  . Hyperglycemia 05/01/2017  . Essential hypertension 05/01/2017    Past Surgical History:  Procedure Laterality Date  . APPENDECTOMY    . CHOLECYSTECTOMY    . COLONOSCOPY    . ESOPHAGOGASTRODUODENOSCOPY N/A 05/02/2017   Procedure: ESOPHAGOGASTRODUODENOSCOPY (EGD);  Surgeon: Malissa Hippo, MD;  Location: [facility];  Service: Endoscopy;  Laterality: N/A;        Home Medications    Prior to Admission medications   Medication Sig Start Date End Date Taking? Authorizing Provider  albuterol (PROVENTIL HFA;VENTOLIN HFA) 108 (90 Base) MCG/ACT inhaler Inhale 1 puff into the lungs every 6 (six) hours as needed for wheezing or shortness of breath.   Yes [provider]  ePHEDrine-guaiFENesin (PRIMATENE ASTHMA PO) Take 1 tablet by mouth See admin instructions. Take 1 tablet by mouth daily on work days   Yes [provider]  ferrous sulfate 325 (65 FE) MG tablet Take 1 tablet (325 mg  total) by mouth 2 (two) times daily with a meal. Patient taking differently: Take 325 mg by mouth daily.  05/03/17  Yes Houston Siren, MD  fluticasone (FLONASE) 50 MCG/ACT nasal spray Place 1 spray into both nostrils daily as needed (seasonal allergies).   Yes [provider]  Multiple Vitamin (MULTIVITAMIN WITH MINERALS) TABS tablet Take 1 tablet by mouth daily.   Yes [provider]  neomycin-bacitracin-polymyxin (NEOSPORIN) OINT Apply 1 application topically 2 (two) times daily as needed for wound care.   Yes [provider]  PRESCRIPTION MEDICATION Take 0.5 tablets by mouth 2 (two) times daily. metoprolol   Yes [provider]  hydrALAZINE (APRESOLINE) 25 MG tablet Take 1 tablet (25 mg total) by mouth 3 (three) times daily. 03/03/18   Blane Ohara, MD  metoprolol tartrate (LOPRESSOR) 25 MG tablet Take 1 tablet (25 mg total) by mouth 2 (two) times daily. 03/03/18 03/03/19  Blane Ohara, MD    Family History No family history on file.  Social History Social History   Tobacco Use  . Smoking status: Never Smoker  . Smokeless tobacco: Never Used  . Tobacco comment: cannabinoid oil (CBD oil) - 3-4 times daily  Substance Use Topics  . Alcohol use: Yes    Comment: denies use since prior "ulcer"  . Drug use: No     Allergies   Patient has no known allergies.   Review of Systems Review of Systems   Physical Exam Updated Vital Signs BP (!) 188/115  Pulse 86   Temp 97.8 F (36.6 C) (Oral)   Resp 14   Ht 6\' 6"  (1.981 m)   Wt 113.4 kg (250 lb)   SpO2 100%   BMI 28.89 kg/m   Physical Exam  Constitutional: He is oriented to person, place, and time. He appears well-developed and well-nourished.  HENT:  Head: Normocephalic and atraumatic.  Eyes: Conjunctivae are normal. Right eye exhibits no discharge. Left eye exhibits no discharge.  Neck: Normal range of motion. Neck supple. No tracheal deviation present.  Cardiovascular: Normal rate and regular  rhythm.  Pulmonary/Chest: Effort normal and breath sounds normal.  Abdominal: Soft. He exhibits no distension. There is no tenderness. There is no guarding.  Musculoskeletal: He exhibits no edema.  Neurological: He is alert and oriented to person, place, and time. GCS eye subscore is 4. GCS verbal subscore is 5. GCS motor subscore is 6.  5+ strength in UE and LE with f/e at major joints. Sensation to palpation intact in UE and LE. CNs 2-12 grossly intact.  EOMFI.  PERRL.   Finger nose and coordination intact bilateral.   Visual fields intact to finger testing. No nystagmus   Skin: Skin is warm. No rash noted.  Psychiatric: He has a normal mood and affect.  Nursing note and vitals reviewed.    ED Treatments / Results  Labs (all labs ordered are listed, but only abnormal results are displayed) Labs Reviewed  CBC - Abnormal; Notable for the following components:      Result Value   Hemoglobin 11.8 (*)    HCT 37.8 (*)    MCV 77.5 (*)    MCH 24.2 (*)    All other components within normal limits  BASIC METABOLIC PANEL - Abnormal; Notable for the following components:   Chloride 99 (*)    Creatinine, Ser 1.35 (*)    Calcium 8.7 (*)    All other components within normal limits    EKG EKG Interpretation  Date/Time:  Sunday March 03 2018 19:37:43 EDT Ventricular Rate:  77 PR Interval:    QRS Duration: 122 QT Interval:  408 QTC Calculation: 462 R Axis:   60 Text Interpretation:  Sinus rhythm Probable left atrial enlargement Nonspecific intraventricular conduction delay Abnormal T, consider ischemia, lateral leads Borderline ST elevation, anterior leads Baseline wander in lead(s) V6 similar previous Confirmed by Blane OharaZavitz, Janaki Exley [phone] on 03/03/2018 8:41:40 PM   Radiology No results found.  Procedures .Critical Care Performed by: Blane OharaZavitz, Jacqulin Brandenburger, MD Authorized by: Blane OharaZavitz, Danilo Cappiello, MD   Critical care provider statement:    Critical care time (minutes):  40   Critical care  start time:  03/03/2018 8:00 PM   Critical care end time:  03/03/2018 8:40 PM   Critical care time was exclusive of:  Separately billable procedures and treating other patients and teaching time   Critical care was necessary to treat or prevent imminent or life-threatening deterioration of the following conditions:  Circulatory failure   Critical care was time spent personally by me on the following activities:  Examination of patient, evaluation of patient's response to treatment and re-evaluation of patient's condition   I assumed direction of critical care for this patient from another provider in my specialty: no     (including critical care time)  Medications Ordered in ED Medications  acetaminophen (TYLENOL) tablet 650 mg (has no administration in time range)  metoCLOPramide (REGLAN) injection 10 mg (10 mg Intravenous Given 03/03/18 1945)  diphenhydrAMINE (BENADRYL) injection 50 mg (50 mg  Intravenous Given 03/03/18 1945)  metoprolol tartrate (LOPRESSOR) injection 5 mg (5 mg Intravenous Given 03/03/18 1945)  lisinopril (PRINIVIL,ZESTRIL) tablet 10 mg (10 mg Oral Given 03/03/18 1945)  hydrALAZINE (APRESOLINE) injection 20 mg (20 mg Intravenous Given 03/03/18 2012)  metoprolol tartrate (LOPRESSOR) injection 5 mg (5 mg Intravenous Given 03/03/18 2154)  hydrALAZINE (APRESOLINE) tablet 25 mg (25 mg Oral Given 03/03/18 2226)     Initial Impression / Assessment and Plan / ED Course  I have reviewed the triage vital signs and the nursing notes.  Pertinent labs & imaging results that were available during my care of the patient were reviewed by me and considered in my medical decision making (see chart for details).    Patient presents with migraine-like headache similar to previous and blood pressure uncontrolled/out of blood pressure medications. Plan for headache cocktail, IV and oral blood pressure medicines, screening blood work and reassessment.  Patient does have worsening kidney function on blood  work review. Patient required multiple IV and oral blood pressure medications however blood pressure rising. Plan for admission to medicine for blood pressure management.  Discussed the case with tried hospitalist who agrees with admission for IV blood pressure management and further workup. CT scan of the head ordered to ensure no hypertensive bleed, based on physical exam low likelihood patient's headache is resolved and normal neurologic exam.  I had two discussions with the patient with significant other in the room and he adamantly does not want to be admitted and will follow up outpatient. Patient understands the risks of persistent elevated blood pressure and has capacity to make decisions. Pt refused head CT.   The patients results and plan were reviewed and discussed.   Any x-rays performed were independently reviewed by myself.   Differential diagnosis were considered with the presenting HPI.  Medications  acetaminophen (TYLENOL) tablet 650 mg (has no administration in time range)  metoCLOPramide (REGLAN) injection 10 mg (10 mg Intravenous Given 03/03/18 1945)  diphenhydrAMINE (BENADRYL) injection 50 mg (50 mg Intravenous Given 03/03/18 1945)  metoprolol tartrate (LOPRESSOR) injection 5 mg (5 mg Intravenous Given 03/03/18 1945)  lisinopril (PRINIVIL,ZESTRIL) tablet 10 mg (10 mg Oral Given 03/03/18 1945)  hydrALAZINE (APRESOLINE) injection 20 mg (20 mg Intravenous Given 03/03/18 2012)  metoprolol tartrate (LOPRESSOR) injection 5 mg (5 mg Intravenous Given 03/03/18 2154)  hydrALAZINE (APRESOLINE) tablet 25 mg (25 mg Oral Given 03/03/18 2226)    Vitals:   03/03/18 2200 03/03/18 2210 03/03/18 2220 03/03/18 2230  BP: (!) 201/125 (!) 186/119 (!) 186/117 (!) 188/115  Pulse: 81 84 87 86  Resp: 10 16 19 14   Temp:      TempSrc:      SpO2: 99% 99% 91% 100%  Weight:      Height:        Final diagnoses:  Hypertensive urgency  Migraine without aura and with status migrainosus, not intractable        Blane Ohara, MD 03/03/18 2335

## 2018-03-03 NOTE — Discharge Instructions (Signed)
Return to the emergency department if you change your mind and would like to be admitted to the hospital to control your blood pressure. Follow-up closely with a primary care doctor for better control of your blood pressure to help prevent stroke heart attack or other life-threatening disease processes. Please take your blood pressure medications as prescribed.

## 2018-11-01 ENCOUNTER — Other Ambulatory Visit: Payer: Self-pay

## 2018-11-01 ENCOUNTER — Encounter (HOSPITAL_COMMUNITY): Payer: Self-pay

## 2018-11-01 ENCOUNTER — Emergency Department (HOSPITAL_COMMUNITY)
Admission: EM | Admit: 2018-11-01 | Discharge: 2018-11-01 | Disposition: A | Discharge status: Home / Self Care | Payer: Self-pay | Attending: Emergency Medicine | Admitting: Emergency Medicine

## 2018-11-01 DIAGNOSIS — I10 Essential (primary) hypertension: Secondary | ICD-10-CM | POA: Insufficient documentation

## 2018-11-01 DIAGNOSIS — Z79899 Other long term (current) drug therapy: Secondary | ICD-10-CM | POA: Insufficient documentation

## 2018-11-01 DIAGNOSIS — R05 Cough: Secondary | ICD-10-CM | POA: Insufficient documentation

## 2018-11-01 DIAGNOSIS — R059 Cough, unspecified: Secondary | ICD-10-CM

## 2018-11-01 MED ORDER — AZITHROMYCIN 250 MG PO TABS: 250.0000 mg | ORAL_TABLET | Freq: Every day | ORAL | 0 refills | Status: DC

## 2018-11-01 NOTE — Discharge Instructions (Signed)
Return for any problem.  Follow-up with your regular care provider as instructed.  Use Z-Pak as prescribed.

## 2018-11-01 NOTE — ED Notes (Signed)
Bed: WTR5 Expected date:  Expected time:  Means of arrival:  Comments: 

## 2018-11-01 NOTE — ED Provider Notes (Signed)
[facility]-EMERGENCY DEPT Provider Note   CSN: [id number] Arrival date & time: 11/01/18  1105     History   Chief Complaint Chief Complaint  Patient presents with  . Cough  . Concussion    HPI Alfred Avila is a 33 y.o. male.  33 year old male with prior medical history as detailed below presents for evaluation of cough.  Patient reports 1 to 2 weeks of persistent cough with mild production.  He denies fever.  He reports prior history of pneumonia.  He desires a prescription for antibiotics to treat a possible pneumonia.  He denies associated shortness of breath, chest pain, nausea, vomiting, or weakness.  Patient does report a mild head injury 3 days ago.  He did not lose consciousness.  He hit his head after he slipped and fell on wet tile.  He did suffer an abrasion to the anterior forehead.  This is healing well.  His tetanus is up-to-date.  He reports mild trouble focusing over the last 2 to 3 days.  Symptoms described are consistent with prior concussive episodes.  The history is provided by the patient and medical records.  Cough  Cough characteristics:  Dry Sputum characteristics:  Nondescript Severity:  Mild Onset quality:  Gradual Duration:  2 weeks Timing:  Constant Progression:  Waxing and waning Chronicity:  New Relieved by:  Nothing Worsened by:  Nothing Ineffective treatments:  None tried   Past Medical History:  Diagnosis Date  . Chronic back pain   . Essential hypertension   . Gastric ulcer   . History of blood transfusion   . Migraines     Patient Active Problem List   Diagnosis Date Noted  . Abnormal CT of brain 08/30/2017  . Hypertensive urgency 08/29/2017  . Hypertensive emergency 06/22/2017  . Symptomatic anemia 05/01/2017  . Upper GI bleed 05/01/2017  . Elevated troponin 05/01/2017  . Hyperglycemia 05/01/2017  . Essential hypertension 05/01/2017    Past Surgical History:  Procedure Laterality Date  . APPENDECTOMY     . CHOLECYSTECTOMY    . COLONOSCOPY    . ESOPHAGOGASTRODUODENOSCOPY N/A 05/02/2017   Procedure: ESOPHAGOGASTRODUODENOSCOPY (EGD);  Surgeon: Malissa Hippoehman, Najeeb U, MD;  Location: [facility];  Service: Endoscopy;  Laterality: N/A;        Home Medications    Prior to Admission medications   Medication Sig Start Date End Date Taking? Authorizing Provider  albuterol (PROVENTIL HFA;VENTOLIN HFA) 108 (90 Base) MCG/ACT inhaler Inhale 1 puff into the lungs every 6 (six) hours as needed for wheezing or shortness of breath.    [provider]  azithromycin (ZITHROMAX) 250 MG tablet Take 1 tablet (250 mg total) by mouth daily. Take first 2 tablets together, then 1 every day until finished. 11/01/18   Wynetta FinesMessick, Peter C, MD  ePHEDrine-guaiFENesin (PRIMATENE ASTHMA PO) Take 1 tablet by mouth See admin instructions. Take 1 tablet by mouth daily on work days    [provider]  ferrous sulfate 325 (65 FE) MG tablet Take 1 tablet (325 mg total) by mouth 2 (two) times daily with a meal. Patient taking differently: Take 325 mg by mouth daily.  05/03/17   [name], Peter, MD  fluticasone (FLONASE) 50 MCG/ACT nasal spray Place 1 spray into both nostrils daily as needed (seasonal allergies).    [provider]  hydrALAZINE (APRESOLINE) 25 MG tablet Take 1 tablet (25 mg total) by mouth 3 (three) times daily. 03/03/18   Blane OharaZavitz, Joshua, MD  metoprolol tartrate (LOPRESSOR) 25 MG  tablet Take 1 tablet (25 mg total) by mouth 2 (two) times daily. 03/03/18 03/03/19  Blane Ohara, MD  Multiple Vitamin (MULTIVITAMIN WITH MINERALS) TABS tablet Take 1 tablet by mouth daily.    [provider]  neomycin-bacitracin-polymyxin (NEOSPORIN) OINT Apply 1 application topically 2 (two) times daily as needed for wound care.    [provider]  PRESCRIPTION MEDICATION Take 0.5 tablets by mouth 2 (two) times daily. metoprolol    [provider]    Family History No family history on file.  Social  History Social History   Tobacco Use  . Smoking status: Never Smoker  . Smokeless tobacco: Never Used  . Tobacco comment: cannabinoid oil (CBD oil) - 3-4 times daily  Substance Use Topics  . Alcohol use: Yes    Comment: denies use since prior "ulcer"  . Drug use: No     Allergies   Patient has no known allergies.   Review of Systems Review of Systems  Respiratory: Positive for cough.   All other systems reviewed and are negative.    Physical Exam Updated Vital Signs Ht 6\' 6"  (1.981 m)   Wt 133.8 kg   BMI 34.09 kg/m   Physical Exam Vitals signs and nursing note reviewed.  Constitutional:      General: He is not in acute distress.    Appearance: He is well-developed.  HENT:     Head: Normocephalic and atraumatic.  Eyes:     Conjunctiva/sclera: Conjunctivae normal.     Pupils: Pupils are equal, round, and reactive to light.  Neck:     Musculoskeletal: Normal range of motion and neck supple.  Cardiovascular:     Rate and Rhythm: Normal rate and regular rhythm.     Heart sounds: Normal heart sounds.  Pulmonary:     Effort: Pulmonary effort is normal. No respiratory distress.     Breath sounds: Normal breath sounds.  Abdominal:     General: There is no distension.     Palpations: Abdomen is soft.     Tenderness: There is no abdominal tenderness.  Musculoskeletal: Normal range of motion.        General: No deformity.  Skin:    General: Skin is warm and dry.     Comments: Small healing abrasion to the anterior forehead  Neurological:     General: No focal deficit present.     Mental Status: He is alert and oriented to person, place, and time. Mental status is at baseline.     Cranial Nerves: No cranial nerve deficit.     Sensory: No sensory deficit.     Motor: No weakness.     Coordination: Coordination normal.      ED Treatments / Results  Labs (all labs ordered are listed, but only abnormal results are displayed) Labs Reviewed - No data to  display  EKG None  Radiology No results found.  Procedures Procedures (including critical care time)  Medications Ordered in ED Medications - No data to display   Initial Impression / Assessment and Plan / ED Course  I have reviewed the triage vital signs and the nursing notes.  Pertinent labs & imaging results that were available during my care of the patient were reviewed by me and considered in my medical decision making (see chart for details).     MDM  Screen complete  Patient is presenting for evaluation of cough and head injury.  Patient's head injury is minor.  Patient without indication for CT  imaging.  Patient offered CT imaging and he declines at this time.  He is instructed on symptomatic treatment of his reported concussive symptoms.  Patient's cough is likely viral in nature.  Patient's exam is not suggestive of significant acute pathology.  Patient does request a course of azithromycin to treat a possible pneumonia.  This will be provided.  Patient is strongly encouraged to follow-up with her regular care provider.  Strict return precautions given and understood. Importance of close follow-up is stressed.  Final Clinical Impressions(s) / ED Diagnoses   Final diagnoses:  Cough    ED Discharge Orders         Ordered    azithromycin (ZITHROMAX) 250 MG tablet  Daily     11/01/18 1128           Wynetta FinesMessick, Peter C, MD 11/01/18 1131
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
## 2018-12-26 IMAGING — CT CT HEAD W/O CM
3 series · 14 of 47 positions shown, 16 images · non-contrast
Comparison: MRI 06/22/2017, CT brain 06/22/2017, 04/16/2015

CLINICAL DATA: Severe headache

EXAM:
CT HEAD WITHOUT CONTRAST
TECHNIQUE: Contiguous axial images were obtained from the base of the skull
through the vertex without intravenous contrast.

[Series 2: head wo · axial · 0.47mm/px · z∈[+232,+367]mm · 8 of 33 slices shown, 10 images]
[im 3/33  brain]
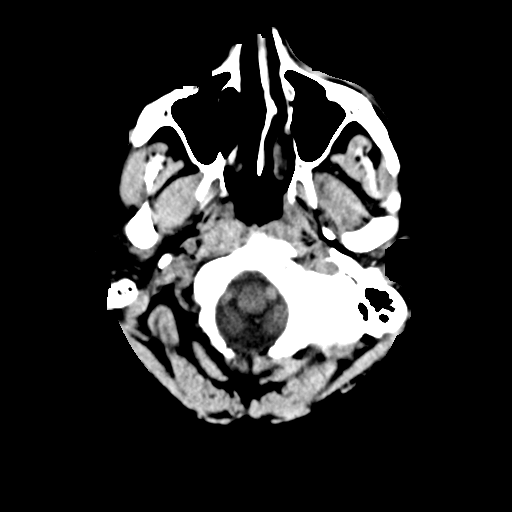
[im 3/33  bone]
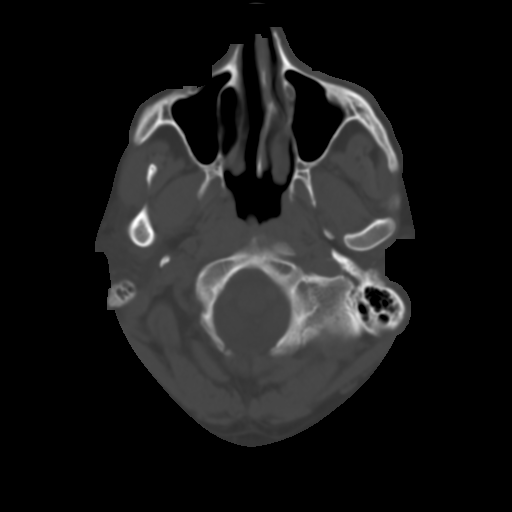
[im 7/33  brain]
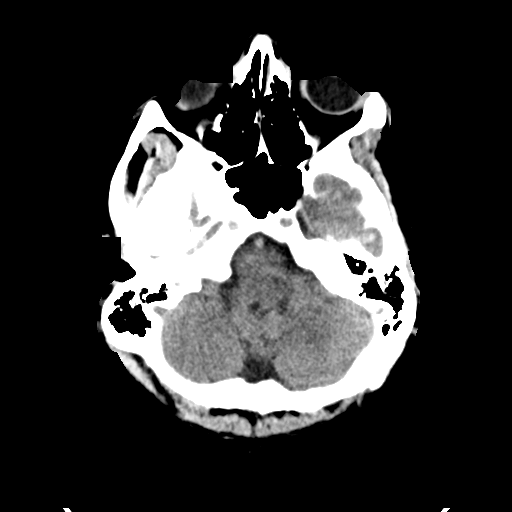
[im 10/33  brain]
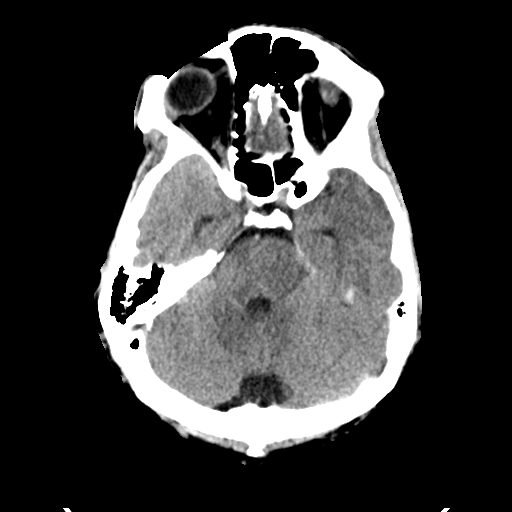
[im 15/33  brain]
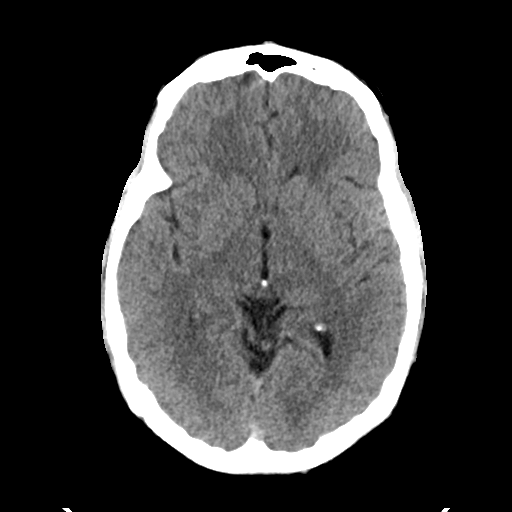
[im 18/33  brain]
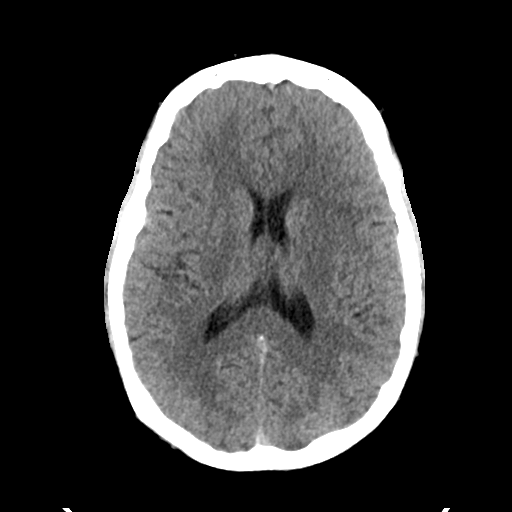
[im 18/33  bone]
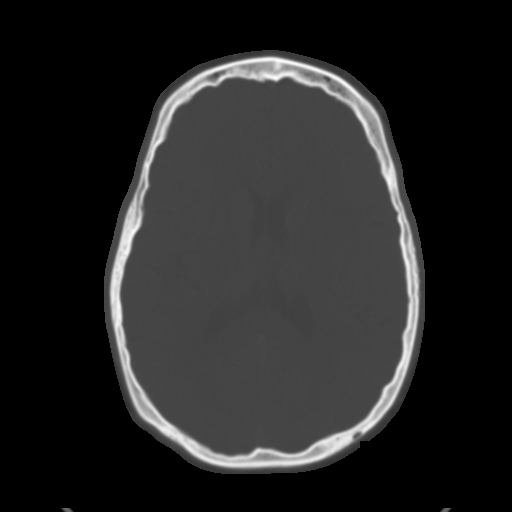
[im 23/33  brain]
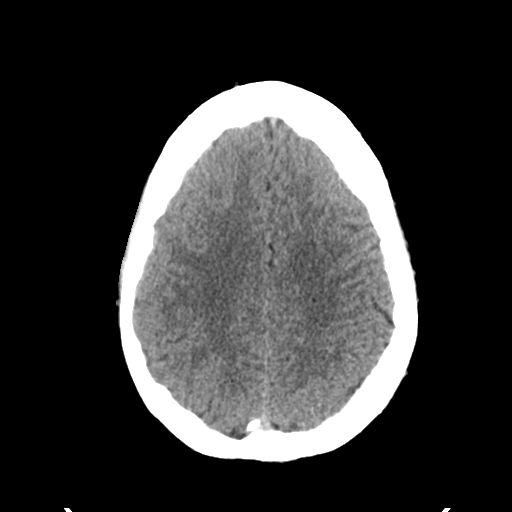
[im 26/33  brain]
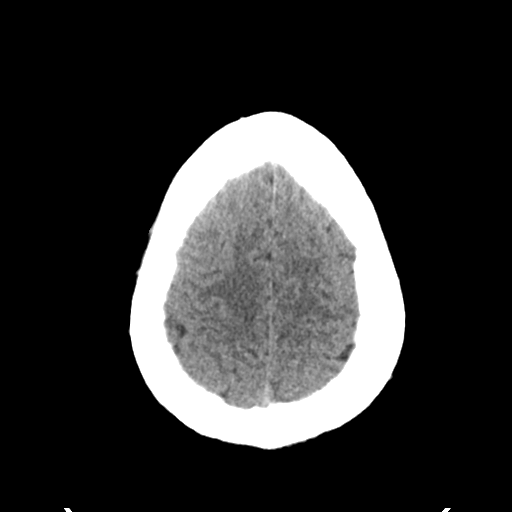
[im 30/33  brain]
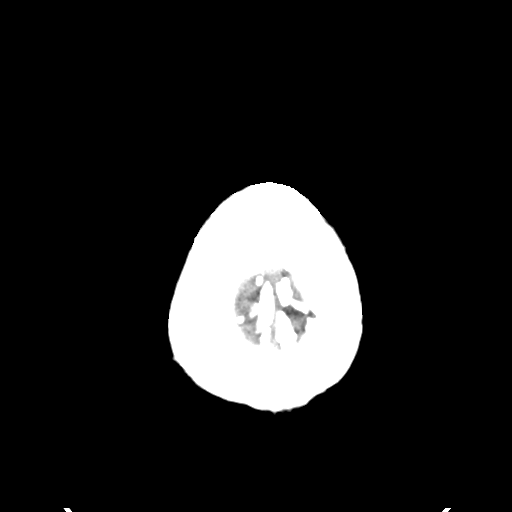

[Series 4: coronal soft tissue · coronal · 0.33mm/px · 3 of 74 slices shown]
[im 25/74  brain]
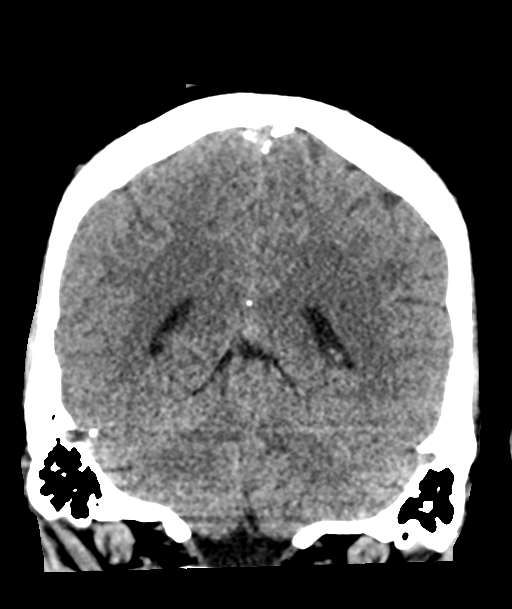
[im 33/74  brain]
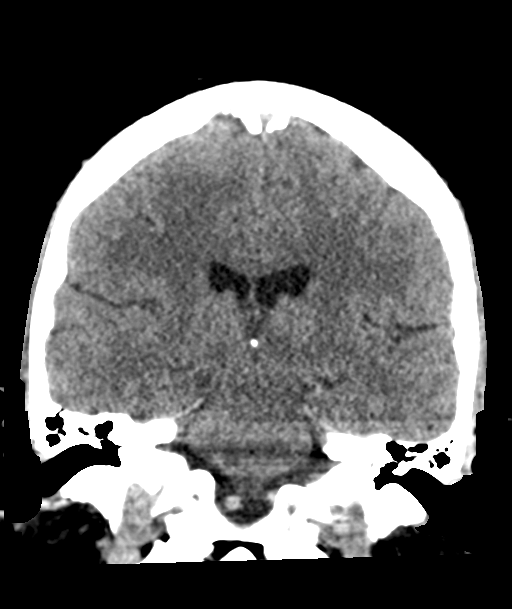
[im 41/74  brain]
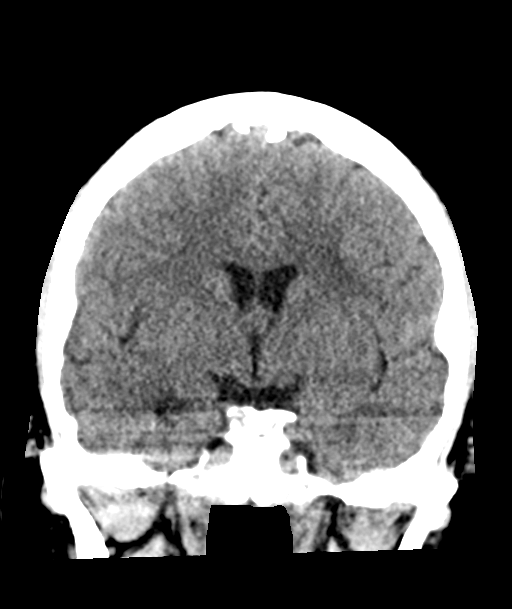

[Series 5: sagittal soft tissue · sagittal · 0.33mm/px · 3 of 67 slices shown]
[im 23/67  brain]
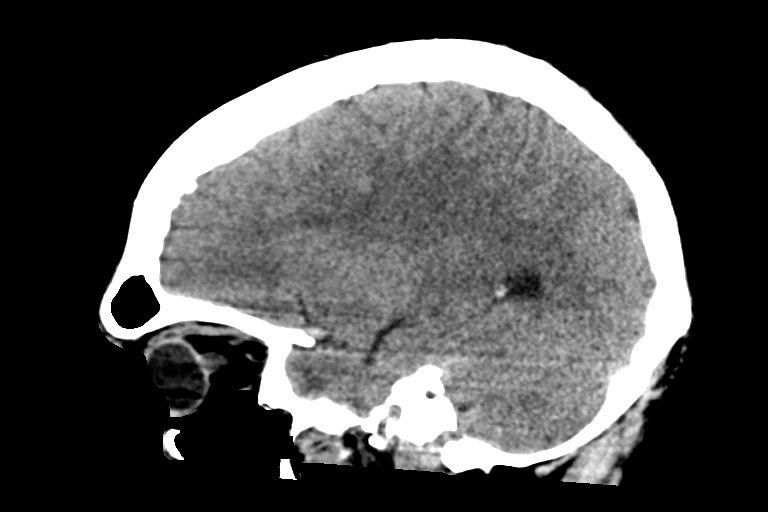
[im 34/67  brain]
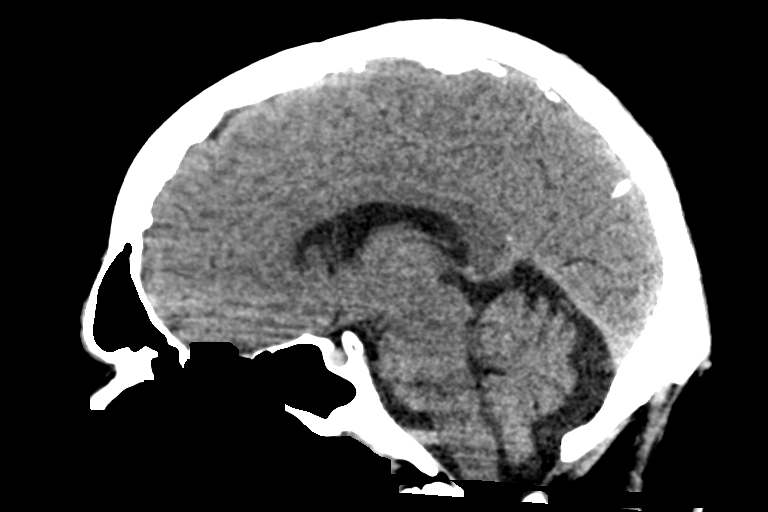
[im 45/67  brain]
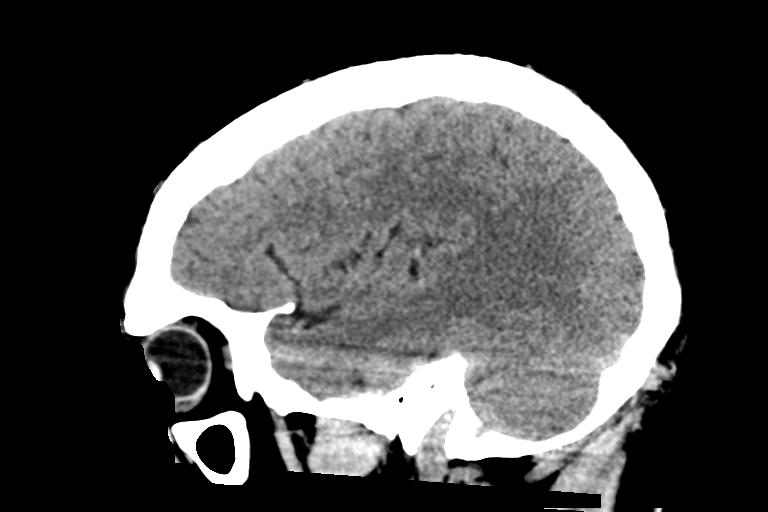

[14 of 47 positions shown; findings below may reference images not displayed]

FINDINGS: Brain: No acute territorial infarction, hemorrhage, or intracranial
mass is visualized. Re- demonstrated scattered foci of hypodensity
within the left greater than right white matter, similar
distribution as compared with 06/22/2017, improved compared with
2563. Possible hypodensity within the pons. Stable ventricle size.
No midline shift

Vascular: No hyperdense vessels.  No unexpected calcification.

Skull: No fracture or suspicious lesion

Sinuses/Orbits: No acute finding.

Other: None
IMPRESSION: 1. Negative for intracranial hemorrhage or mass.
2. Scattered hypodensities within the left greater than right white
matter, grossly similar distribution compared with 06/22/2017.
Possible diffuse hypodensity in the pons, patient was noted to have
diffusely abnormal signal in the pons on the 2563 MRI which
subsequently resolved, this was thought related to the history of
hypertension; MRI correlation would be helpful to assess for
recurrent signal abnormality in the pons given history.

## 2019-03-10 ENCOUNTER — Ambulatory Visit (INDEPENDENT_AMBULATORY_CARE_PROVIDER_SITE_OTHER): Payer: Self-pay | Admitting: Primary Care

## 2019-03-10 ENCOUNTER — Encounter (INDEPENDENT_AMBULATORY_CARE_PROVIDER_SITE_OTHER): Payer: Self-pay | Admitting: Primary Care

## 2019-03-10 ENCOUNTER — Other Ambulatory Visit: Payer: Self-pay

## 2019-03-10 VITALS — BP 138/100 | HR 74 | Temp 98.3°F | Ht 78.0 in | Wt 300.0 lb

## 2019-03-10 DIAGNOSIS — Z7689 Persons encountering health services in other specified circumstances: Secondary | ICD-10-CM

## 2019-03-10 DIAGNOSIS — I10 Essential (primary) hypertension: Secondary | ICD-10-CM

## 2019-03-10 DIAGNOSIS — G44009 Cluster headache syndrome, unspecified, not intractable: Secondary | ICD-10-CM

## 2019-03-10 MED ORDER — METOPROLOL TARTRATE 25 MG PO TABS: 25.0000 mg | ORAL_TABLET | Freq: Two times a day (BID) | ORAL | 0 refills | Status: DC

## 2019-03-10 MED ORDER — ONDANSETRON HCL 4 MG PO TABS: 4.0000 mg | ORAL_TABLET | Freq: Three times a day (TID) | ORAL | 1 refills | Status: AC | PRN

## 2019-03-10 MED ORDER — LISINOPRIL-HYDROCHLOROTHIAZIDE 20-25 MG PO TABS: 1.0000 | ORAL_TABLET | Freq: Every day | ORAL | 0 refills | Status: DC

## 2019-03-10 MED ORDER — AMLODIPINE BESYLATE 10 MG PO TABS: 10.0000 mg | ORAL_TABLET | Freq: Every day | ORAL | 0 refills | Status: DC
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
## 2019-04-14 ENCOUNTER — Other Ambulatory Visit (INDEPENDENT_AMBULATORY_CARE_PROVIDER_SITE_OTHER): Payer: Self-pay

## 2019-06-16 ENCOUNTER — Encounter (INDEPENDENT_AMBULATORY_CARE_PROVIDER_SITE_OTHER): Payer: Self-pay | Admitting: Primary Care

## 2019-06-16 ENCOUNTER — Ambulatory Visit (INDEPENDENT_AMBULATORY_CARE_PROVIDER_SITE_OTHER): Payer: Self-pay | Admitting: Primary Care

## 2019-06-16 ENCOUNTER — Other Ambulatory Visit: Payer: Self-pay

## 2019-06-16 VITALS — BP 140/90 | HR 78 | Temp 97.7°F | Ht 78.0 in | Wt 299.0 lb

## 2019-06-16 DIAGNOSIS — R197 Diarrhea, unspecified: Secondary | ICD-10-CM

## 2019-06-16 DIAGNOSIS — F4310 Post-traumatic stress disorder, unspecified: Secondary | ICD-10-CM

## 2019-06-16 DIAGNOSIS — R739 Hyperglycemia, unspecified: Secondary | ICD-10-CM

## 2019-06-16 DIAGNOSIS — I10 Essential (primary) hypertension: Secondary | ICD-10-CM

## 2019-06-16 DIAGNOSIS — G44009 Cluster headache syndrome, unspecified, not intractable: Secondary | ICD-10-CM

## 2019-06-16 MED ORDER — LISINOPRIL-HYDROCHLOROTHIAZIDE 20-25 MG PO TABS: 1.0000 | ORAL_TABLET | Freq: Every day | ORAL | 0 refills | Status: DC

## 2019-06-16 MED ORDER — TRAZODONE HCL 50 MG PO TABS: 25.0000 mg | ORAL_TABLET | Freq: Every evening | ORAL | 3 refills | Status: DC | PRN

## 2019-06-16 MED ORDER — AMLODIPINE BESYLATE 10 MG PO TABS: 10.0000 mg | ORAL_TABLET | Freq: Every day | ORAL | 0 refills | Status: DC

## 2019-06-16 MED ORDER — METOPROLOL TARTRATE 50 MG PO TABS: 25.0000 mg | ORAL_TABLET | Freq: Two times a day (BID) | ORAL | 0 refills | Status: DC

## 2019-06-16 NOTE — Patient Instructions (Signed)
Cluster Headache Cluster headaches are deeply painful. They normally occur on one side of your head, but they may switch sides. Often, cluster headaches:  Are severe.  Happen often for a few weeks or months and then go away for a while.  Last from 15 minutes to 3 hours.  Happen at the same time each day.  Happen at night.  Happen many times a day. Follow these instructions at home:        Follow instructions from your doctor to care for yourself at home:  Go to bed at the same time each night. Get the same amount of sleep every night.  Avoid alcohol.  Stop smoking if you smoke. This includes cigarettes and e-cigarettes.  Take over-the-counter and prescription medicines only as told by your doctor.  Do not drive or use heavy machinery while taking prescription pain medicine.  Use oxygen as told by your doctor.  Exercise regularly.  Eat a healthy diet.  Write down when each headache happened, what kind of pain you had, how bad your pain was, and what you tried to help your pain. This is called a headache diary. Use it as told by your doctor. Contact a doctor if:  Your headaches get worse or they happen more often.  Your medicines are not helping. Get help right away if:  You pass out (faint).  You get weak or lose feeling (have numbness) on one side of your body or face.  You see two of everything (double vision).  You feel sick to your stomach (nauseous) or you throw up (vomit), and you do not stop after many hours.  You have trouble with your balance or with walking.  You have trouble talking.  You have neck pain or stiffness.  You have a fever. This information is not intended to replace advice given to you by your health care provider. Make sure you discuss any questions you have with your health care provider. Document Released: 10/26/2004 Document Revised: 12/14/2017 Document Reviewed: 05/26/2016 Elsevier Patient Education  2020 [employer]  

## 2019-06-16 NOTE — Progress Notes (Signed)
Established Patient Office Visit  Subjective:  Patient ID: Alfred Avila, male    DOB: [birth year]  Age: 33 y.o. MRN: [id number]  CC:  Chief Complaint  Patient presents with  . Follow-up    BP    HPI Alfred Avila presents for blood pressure re-evaluation patient came in fasting for labs and has not taken his Bp medications today. He denies shortness of breath, headaches, chest pain or lower extremity edema  Past Medical History:  Diagnosis Date  . Chronic back pain   . Essential hypertension   . Gastric ulcer   . History of blood transfusion   . Migraines     Past Surgical History:  Procedure Laterality Date  . APPENDECTOMY    . CHOLECYSTECTOMY    . COLONOSCOPY    . ESOPHAGOGASTRODUODENOSCOPY N/A 05/02/2017   Procedure: ESOPHAGOGASTRODUODENOSCOPY (EGD);  Surgeon: Rogene Houston, MD;  Location: [facility];  Service: Endoscopy;  Laterality: N/A;    History reviewed. No pertinent family history.  Social History   Socioeconomic History  . Marital status: Single    Spouse name: Not on file  . Number of children: Not on file  . Years of education: Not on file  . Highest education level: Not on file  Occupational History  . Not on file  Social Needs  . Financial resource strain: Not on file  . Food insecurity    Worry: Not on file    Inability: Not on file  . Transportation needs    Medical: Not on file    Non-medical: Not on file  Tobacco Use  . Smoking status: Never Smoker  . Smokeless tobacco: Never Used  . Tobacco comment: cannabinoid oil (CBD oil) - 3-4 times daily  Substance and Sexual Activity  . Alcohol use: Yes    Comment: denies use since prior "ulcer"  . Drug use: No  . Sexual activity: Not on file  Lifestyle  . Physical activity    Days per week: Not on file    Minutes per session: Not on file  . Stress: Not on file  Relationships  . Social Herbalist on phone: Not on file    Gets together: Not on file    Attends religious  service: Not on file    Active member of club or organization: Not on file    Attends meetings of clubs or organizations: Not on file    Relationship status: Not on file  . Intimate partner violence    Fear of current or ex partner: Not on file    Emotionally abused: Not on file    Physically abused: Not on file    Forced sexual activity: Not on file  Other Topics Concern  . 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    Outpatient Medications Prior to Visit  Medication Sig Dispense Refill  . albuterol (PROVENTIL HFA;VENTOLIN HFA) 108 (90 Base) MCG/ACT inhaler Inhale 1 puff into the lungs every 6 (six) hours as needed for wheezing or shortness of breath.    . fluticasone (FLONASE) 50 MCG/ACT nasal spray Place 1 spray into both nostrils daily as needed (seasonal allergies).    . ondansetron (ZOFRAN-ODT) 4 MG disintegrating tablet Take by mouth.    Marland Kitchen amLODipine (NORVASC) 10 MG tablet Take 1 tablet (10 mg total) by mouth daily. 90 tablet  0  . lisinopril-hydrochlorothiazide (ZESTORETIC) 20-25 MG tablet Take 1 tablet by mouth daily. 90 tablet 0  . metoprolol tartrate (LOPRESSOR) 25 MG tablet Take 1 tablet (25 mg total) by mouth 2 (two) times daily. 180 tablet 0  . Multiple Vitamin (MULTIVITAMIN WITH MINERALS) TABS tablet Take 1 tablet by mouth daily.    Marland Kitchen PRESCRIPTION MEDICATION Take 0.5 tablets by mouth 2 (two) times daily. metoprolol    . ferrous sulfate 325 (65 FE) MG tablet Take 1 tablet (325 mg total) by mouth 2 (two) times daily with a meal. (Patient taking differently: Take 325 mg by mouth daily. ) 60 tablet 1  . neomycin-bacitracin-polymyxin (NEOSPORIN) OINT Apply 1 application topically 2 (two) times daily as needed for wound care.     No facility-administered medications prior to visit.     Allergies  Allergen Reactions  . Tetanus Toxoids Anaphylaxis    ROS Review  of Systems  Gastrointestinal: Positive for abdominal distention and diarrhea.  Neurological: Positive for headaches.      Objective:    Physical Exam  Constitutional: He is oriented to person, place, and time. He appears well-developed and well-nourished.  Neck: Normal range of motion. Neck supple.  Cardiovascular: Normal rate and regular rhythm.  Pulmonary/Chest: Effort normal and breath sounds normal.  Abdominal: Soft. Bowel sounds are normal.  Musculoskeletal: Normal range of motion.  Neurological: He is oriented to person, place, and time.  Skin: Skin is dry.  Psychiatric: He has a normal mood and affect.    BP 140/90 (BP Location: Left Arm, Patient Position: Sitting, Cuff Size: Large)   Pulse 78   Temp 97.7 F (36.5 C) (Tympanic)   Ht 6' 6"  (1.981 m)   Wt 299 lb (135.6 kg)   SpO2 94%   BMI 34.55 kg/m  Wt Readings from Last 3 Encounters:  06/16/19 299 lb (135.6 kg)  03/10/19 300 lb (136.1 kg)  11/01/18 295 lb (133.8 kg)     Health Maintenance Due  Topic Date Due  . INFLUENZA VACCINE  05/03/2019    There are no preventive care reminders to display for this patient.  Lab Results  Component Value Date   TSH 1.058 06/22/2017   Lab Results  Component Value Date   WBC 7.1 03/03/2018   HGB 11.8 (L) 03/03/2018   HCT 37.8 (L) 03/03/2018   MCV 77.5 (L) 03/03/2018   PLT 387 03/03/2018   Lab Results  Component Value Date   NA 135 03/03/2018   K 3.9 03/03/2018   CO2 26 03/03/2018   GLUCOSE 98 03/03/2018   BUN 18 03/03/2018   CREATININE 1.35 (H) 03/03/2018   BILITOT 0.6 08/29/2017   ALKPHOS 102 08/29/2017   AST 29 08/29/2017   ALT 23 08/29/2017   PROT 8.6 (H) 08/29/2017   ALBUMIN 4.9 08/29/2017   CALCIUM 8.7 (L) 03/03/2018   ANIONGAP 10 03/03/2018   No results found for: CHOL No results found for: HDL No results found for: LDLCALC No results found for: TRIG No results found for: CHOLHDL No results found for: HGBA1C    Assessment & Plan:    Problem List Items Addressed This Visit    Essential hypertension - Primary (Chronic)   Relevant Medications   lisinopril-hydrochlorothiazide (ZESTORETIC) 20-25 MG tablet   amLODipine (NORVASC) 10 MG tablet   metoprolol tartrate (LOPRESSOR) 50 MG tablet   Other Relevant Orders   CBC with Differential   CMP14+EGFR   Lipid Panel   Hyperglycemia    Other Visit Diagnoses  Cluster headache, not intractable, unspecified chronicity pattern       Relevant Medications   amLODipine (NORVASC) 10 MG tablet   metoprolol tartrate (LOPRESSOR) 50 MG tablet   traZODone (DESYREL) 50 MG tablet   PTSD (post-traumatic stress disorder)       Relevant Medications   traZODone (DESYREL) 50 MG tablet      Meds ordered this encounter  Medications  . lisinopril-hydrochlorothiazide (ZESTORETIC) 20-25 MG tablet    Sig: Take 1 tablet by mouth daily.    Dispense:  90 tablet    Refill:  0  . amLODipine (NORVASC) 10 MG tablet    Sig: Take 1 tablet (10 mg total) by mouth daily.    Dispense:  90 tablet    Refill:  0  . metoprolol tartrate (LOPRESSOR) 50 MG tablet    Sig: Take 0.5 tablets (25 mg total) by mouth 2 (two) times daily.    Dispense:  180 tablet    Refill:  0  . traZODone (DESYREL) 50 MG tablet    Sig: Take 0.5-1 tablets (25-50 mg total) by mouth at bedtime as needed for sleep.    Dispense:  30 tablet    Refill:  3  Talmadge was seen today for follow-up.  Diagnoses and all orders for this visit:  Essential hypertension Increased metoprolol 15m bid Goal BP:  For patients younger than 60: Goal BP < 130/80 For patients 60 and older: Goal BP < 150/90. For patients with diabetes: Goal BP < 140/90. Your most recent BP: (!) 140/90 Take your medications faithfully as instructed. Maintain a healthy weight. Get at least 150 minutes of aerobic exercise per week. Minimize salt intake. Minimize alcohol intake  -     CBC with Differential -     CMP14+EGFR -     Lipid Panel -     amLODipine  (NORVASC) 10 MG tablet; Take 1 tablet (10 mg total) by mouth daily. -     metoprolol tartrate (LOPRESSOR) 50 MG tablet; Take 0.5 tablets (25 mg total) by mouth 2 (two) times daily.  Hyperglycemia Recommendations For Diabetic/Prediabetic Patients:  - Exercise at least 5 times a week for 30 minutes or preferably daily.  - No Smoking - Drink less than 2 drinks a day.  - Monitor your feet for sores - Have yearly Eye Exams - Recommend annual Flu vaccine  - Recommend Pneumovax and Prevnar vaccines - Shingles Vaccine (Zostavax) if over 630y.o. Goals:  - BMI less than 24 - Fasting sugar less than 130 or less than 150 if tapering medicines to lose weight  - Systolic BP less than 1625 - Diastolic BP less than 80 - Bad LDL Cholesterol less than 70 - Triglycerides less than 150  Cluster headache, not intractable, unspecified chronicity pattern Signs can be individualized with throbbing pain on any area of the head.  They may come with an aura-dizziness, nausea, sensitive to light sound or smell.  Triggers can be caused by drinking alcohol smoking or certain medications, eating and drinking certain products  This condition may be triggered or caused by: Caffeine, aged cheese, and chocolate.  PTSD (post-traumatic stress disorder Post-traumatic stress disorder (PTSD) is a mental health condition that's triggered by a terrifying event - either experiencing it or witnessing it. Symptoms may include flashbacks, nightmares and severe anxiety, as well as uncontrollable thoughts about the event.Most people who go through traumatic events may have temporary difficulty adjusting and coping, but with time and good self-care, they usually get  better.  Other orders -     lisinopril-hydrochlorothiazide (ZESTORETIC) 20-25 MG tablet; Take 1 tablet by mouth daily. -     traZODone (DESYREL) 50 MG tablet; Take 0.5-1 tablets (25-50 mg total) by mouth at bedtime as needed for sleep.    Follow-up: Return in about 6  weeks (around 07/28/2019) for PTSD started new meds.    Kerin Perna, NP

## 2019-06-17 ENCOUNTER — Other Ambulatory Visit (INDEPENDENT_AMBULATORY_CARE_PROVIDER_SITE_OTHER): Payer: Self-pay | Admitting: Primary Care

## 2019-06-17 LAB — CBC WITH DIFFERENTIAL/PLATELET
Basophils Absolute: 0 10*3/uL (ref 0.0–0.2)
Basos: 1 %
EOS (ABSOLUTE): 0.2 10*3/uL (ref 0.0–0.4)
Eos: 3 %
Hematocrit: 54 % — ABNORMAL HIGH (ref 37.5–51.0)
Hemoglobin: 18.3 g/dL — ABNORMAL HIGH (ref 13.0–17.7)
Immature Grans (Abs): 0 10*3/uL (ref 0.0–0.1)
Immature Granulocytes: 0 %
Lymphocytes Absolute: 1.8 10*3/uL (ref 0.7–3.1)
Lymphs: 28 %
MCH: 29.3 pg (ref 26.6–33.0)
MCHC: 33.9 g/dL (ref 31.5–35.7)
MCV: 87 fL (ref 79–97)
Monocytes Absolute: 0.4 10*3/uL (ref 0.1–0.9)
Monocytes: 7 %
Neutrophils Absolute: 3.8 10*3/uL (ref 1.4–7.0)
Neutrophils: 61 %
Platelets: 307 10*3/uL (ref 150–450)
RBC: 6.24 x10E6/uL — ABNORMAL HIGH (ref 4.14–5.80)
RDW: 14 % (ref 11.6–15.4)
WBC: 6.2 10*3/uL (ref 3.4–10.8)

## 2019-06-17 LAB — CMP14+EGFR
ALT: 27 IU/L (ref 0–44)
AST: 24 IU/L (ref 0–40)
Albumin/Globulin Ratio: 1.9 (ref 1.2–2.2)
Albumin: 5 g/dL (ref 4.0–5.0)
Alkaline Phosphatase: 87 IU/L (ref 39–117)
BUN/Creatinine Ratio: 14 (ref 9–20)
BUN: 21 mg/dL — ABNORMAL HIGH (ref 6–20)
Bilirubin Total: 0.5 mg/dL (ref 0.0–1.2)
CO2: 23 mmol/L (ref 20–29)
Calcium: 10 mg/dL (ref 8.7–10.2)
Chloride: 102 mmol/L (ref 96–106)
Creatinine, Ser: 1.46 mg/dL — ABNORMAL HIGH (ref 0.76–1.27)
GFR calc Af Amer: 73 mL/min/{1.73_m2} (ref >59)
GFR calc non Af Amer: 63 mL/min/{1.73_m2} (ref >59)
Globulin, Total: 2.6 g/dL (ref 1.5–4.5)
Glucose: 99 mg/dL (ref 65–99)
Potassium: 5.1 mmol/L (ref 3.5–5.2)
Sodium: 141 mmol/L (ref 134–144)
Total Protein: 7.6 g/dL (ref 6.0–8.5)

## 2019-06-17 LAB — LIPID PANEL
Chol/HDL Ratio: 4.8 ratio (ref 0.0–5.0)
Cholesterol, Total: 209 mg/dL — ABNORMAL HIGH (ref 100–199)
HDL: 44 mg/dL (ref >39)
LDL Chol Calc (NIH): 136 mg/dL — ABNORMAL HIGH (ref 0–99)
Triglycerides: 160 mg/dL — ABNORMAL HIGH (ref 0–149)
VLDL Cholesterol Cal: 29 mg/dL (ref 5–40)

## 2019-06-17 MED ORDER — ATORVASTATIN CALCIUM 20 MG PO TABS: 20.0000 mg | ORAL_TABLET | Freq: Every day | ORAL | 3 refills | Status: DC

## 2019-06-18 ENCOUNTER — Telehealth (INDEPENDENT_AMBULATORY_CARE_PROVIDER_SITE_OTHER): Payer: Self-pay

## 2019-06-18 NOTE — Telephone Encounter (Signed)
-----   Message from Kerin Perna, NP sent at 06/17/2019 10:59 AM EDT ----- Elevated red bloods cells can be associated with smoking . Kidney function slightly elevated  Increase water and do not use ibuprofen. Elevated cholesterol . To lower your number you can decrease your fatty foods, red meat, cheese, milk and increase fiber like whole grains and veggies. You can also add a fiber supplement like Metamucil or Benefiber. Sent in atorvastin 20mg  take at night

## 2019-06-18 NOTE — Telephone Encounter (Signed)
Patient verified date of birth and was provided results. He is aware of elevated cholesterol; atorvastatin 20 mg added to take daily at night. Advised patient to decrease fatty foods, red meat, cheese and milk and increase fiber rich foods. Kidney function slightly elevated increase water and do not take ibuprofen. Elevated RBC possibly associated with smoking. Patient is not a smoker. Patient did not have any questions and expressed understanding of all results. Nat Christen, CMA

## 2019-06-28 ENCOUNTER — Emergency Department (HOSPITAL_COMMUNITY)
Admission: EM | Admit: 2019-06-28 | Discharge: 2019-06-28 | Disposition: A | Discharge status: Home / Self Care | Payer: Self-pay | Attending: Emergency Medicine | Admitting: Emergency Medicine

## 2019-06-28 ENCOUNTER — Emergency Department (HOSPITAL_COMMUNITY): Payer: Self-pay

## 2019-06-28 ENCOUNTER — Encounter (HOSPITAL_COMMUNITY): Payer: Self-pay

## 2019-06-28 ENCOUNTER — Other Ambulatory Visit: Payer: Self-pay

## 2019-06-28 DIAGNOSIS — Z87442 Personal history of urinary calculi: Secondary | ICD-10-CM | POA: Insufficient documentation

## 2019-06-28 DIAGNOSIS — M545 Low back pain, unspecified: Secondary | ICD-10-CM

## 2019-06-28 DIAGNOSIS — Z79899 Other long term (current) drug therapy: Secondary | ICD-10-CM | POA: Insufficient documentation

## 2019-06-28 DIAGNOSIS — R202 Paresthesia of skin: Secondary | ICD-10-CM | POA: Insufficient documentation

## 2019-06-28 DIAGNOSIS — I10 Essential (primary) hypertension: Secondary | ICD-10-CM | POA: Insufficient documentation

## 2019-06-28 DIAGNOSIS — R200 Anesthesia of skin: Secondary | ICD-10-CM | POA: Insufficient documentation

## 2019-06-28 DIAGNOSIS — R410 Disorientation, unspecified: Secondary | ICD-10-CM

## 2019-06-28 DIAGNOSIS — Z87820 Personal history of traumatic brain injury: Secondary | ICD-10-CM | POA: Insufficient documentation

## 2019-06-28 DIAGNOSIS — R39198 Other difficulties with micturition: Secondary | ICD-10-CM

## 2019-06-28 LAB — CBC
HCT: 51.2 % (ref 39.0–52.0)
Hemoglobin: 17.1 g/dL — ABNORMAL HIGH (ref 13.0–17.0)
MCH: 29.3 pg (ref 26.0–34.0)
MCHC: 33.4 g/dL (ref 30.0–36.0)
MCV: 87.8 fL (ref 80.0–100.0)
Platelets: 312 10*3/uL (ref 150–400)
RBC: 5.83 MIL/uL — ABNORMAL HIGH (ref 4.22–5.81)
RDW: 13.6 % (ref 11.5–15.5)
WBC: 6.4 10*3/uL (ref 4.0–10.5)
nRBC: 0 % (ref 0.0–0.2)

## 2019-06-28 LAB — URINALYSIS, ROUTINE W REFLEX MICROSCOPIC
Bilirubin Urine: NEGATIVE
Glucose, UA: NEGATIVE mg/dL
Hgb urine dipstick: NEGATIVE
Ketones, ur: NEGATIVE mg/dL
Leukocytes,Ua: NEGATIVE
Nitrite: NEGATIVE
Protein, ur: NEGATIVE mg/dL
Specific Gravity, Urine: 1.018 (ref 1.005–1.030)
pH: 5 (ref 5.0–8.0)

## 2019-06-28 LAB — BASIC METABOLIC PANEL
Anion gap: 10 (ref 5–15)
BUN: 16 mg/dL (ref 6–20)
CO2: 26 mmol/L (ref 22–32)
Calcium: 9.2 mg/dL (ref 8.9–10.3)
Chloride: 103 mmol/L (ref 98–111)
Creatinine, Ser: 1.53 mg/dL — ABNORMAL HIGH (ref 0.61–1.24)
GFR calc Af Amer: >60 mL/min (ref >60)
GFR calc non Af Amer: 59 mL/min — ABNORMAL LOW (ref >60)
Glucose, Bld: 118 mg/dL — ABNORMAL HIGH (ref 70–99)
Potassium: 4.6 mmol/L (ref 3.5–5.1)
Sodium: 139 mmol/L (ref 135–145)

## 2019-06-28 MED ORDER — CYCLOBENZAPRINE HCL 10 MG PO TABS: 10.0000 mg | ORAL_TABLET | Freq: Two times a day (BID) | ORAL | 0 refills | Status: AC | PRN

## 2019-06-28 MED ORDER — HYDROCODONE-ACETAMINOPHEN 5-325 MG PO TABS
1.0000 | ORAL_TABLET | Freq: Once | ORAL | Status: AC
  Administered 2019-06-28: 19:00:00 1 via ORAL
  Filled 2019-06-28: qty 1

## 2019-06-28 NOTE — ED Provider Notes (Addendum)
[facility] Provider Note   CSN: [id number] Arrival date & time: 06/28/19  1605     History   Chief Complaint Chief Complaint  Patient presents with   Hematuria    HPI Alfred LeydenDustin Avila is a 33 y.o. male with a past medical history significant for essential hypertension, gastric ulcer, TBI in 2012, and migraines who presents to the ED on 9/26 due to severe, 9/10, right-sided low back pain which began after his niece jumped on his back on Wednesday. Pain is worse with any type of movement, especially sitting and standing movements. Back pain is associated with hematuria. Patient states on Wednesday his urine looked like "red jello", but admits he has not had any visible blood for 1.5 days. Patient does have a history of kidney stones with his last being roughly 2 years ago. After the injury, patient admits to having left arm numbness and difficulty urinating. Patient denies saddle paresthesias. Patient has not taken any pain medication at home due to history of gastric ulcer. Of note, patient states that his sister mentioned to him that he has been more delirious the past few days with mild confusion and memory loss. Patient had a TBI in 2012, so unsure about baseline of patient. Patient denies fever, chest pain, and shortness of breath.  Past Medical History:  Diagnosis Date   Chronic back pain    Essential hypertension    Gastric ulcer    History of blood transfusion    Migraines     Patient Active Problem List   Diagnosis Date Noted   Abnormal CT of brain 08/30/2017   Hypertensive urgency 08/29/2017   Hypertensive emergency 06/22/2017   Symptomatic anemia 05/01/2017   Upper GI bleed 05/01/2017   Elevated troponin 05/01/2017   Hyperglycemia 05/01/2017   Essential hypertension 05/01/2017    Past Surgical History:  Procedure Laterality Date   APPENDECTOMY     CHOLECYSTECTOMY     COLONOSCOPY     ESOPHAGOGASTRODUODENOSCOPY  N/A 05/02/2017   Procedure: ESOPHAGOGASTRODUODENOSCOPY (EGD);  Surgeon: Malissa Hippoehman, Najeeb U, MD;  Location: [facility];  Service: Endoscopy;  Laterality: N/A;        Home Medications    Prior to Admission medications   Medication Sig Start Date End Date Taking? Authorizing Provider  albuterol (PROVENTIL HFA;VENTOLIN HFA) 108 (90 Base) MCG/ACT inhaler Inhale 1 puff into the lungs every 6 (six) hours as needed for wheezing or shortness of breath.    [provider]  amLODipine (NORVASC) 10 MG tablet Take 1 tablet (10 mg total) by mouth daily. 06/16/19   Grayce SessionsEdwards, Michelle P, NP  atorvastatin (LIPITOR) 20 MG tablet Take 1 tablet (20 mg total) by mouth daily. 06/17/19   Grayce SessionsEdwards, Michelle P, NP  cyclobenzaprine (FLEXERIL) 10 MG tablet Take 1 tablet (10 mg total) by mouth 2 (two) times daily as needed for up to 7 days (as needed for back pain). 06/28/19 07/05/19  Cheek, Vesta Mixeraroline B, PA-C  fluticasone (FLONASE) 50 MCG/ACT nasal spray Place 1 spray into both nostrils daily as needed (seasonal allergies).    [provider]  lisinopril-hydrochlorothiazide (ZESTORETIC) 20-25 MG tablet Take 1 tablet by mouth daily. 06/16/19   Grayce SessionsEdwards, Michelle P, NP  metoprolol tartrate (LOPRESSOR) 50 MG tablet Take 0.5 tablets (25 mg total) by mouth 2 (two) times daily. 06/16/19 06/15/20  Grayce SessionsEdwards, Michelle P, NP  Multiple Vitamin (MULTIVITAMIN WITH MINERALS) TABS tablet Take 1 tablet by mouth daily.    [provider]  ondansetron (  ZOFRAN-ODT) 4 MG disintegrating tablet Take by mouth. 11/04/18   [provider]  PRESCRIPTION MEDICATION Take 0.5 tablets by mouth 2 (two) times daily. metoprolol    [provider]  traZODone (DESYREL) 50 MG tablet Take 0.5-1 tablets (25-50 mg total) by mouth at bedtime as needed for sleep. 06/16/19   Kerin Perna, NP    Family History History reviewed. No pertinent family history.  Social History Social History   Tobacco Use   Smoking status:  Never Smoker   Smokeless tobacco: Never Used   Tobacco comment: cannabinoid oil (CBD oil) - 3-4 times daily  Substance Use Topics   Alcohol use: Yes    Comment: denies use since prior "ulcer"   Drug use: No     Allergies   Tetanus toxoids   Review of Systems Review of Systems  Constitutional: Negative for chills and fever.  HENT: Negative for sore throat.   Eyes: Negative for visual disturbance.  Respiratory: Negative for shortness of breath.   Cardiovascular: Negative for chest pain and palpitations.  Gastrointestinal: Positive for abdominal pain. Negative for abdominal distention, diarrhea, nausea and vomiting.  Genitourinary: Positive for difficulty urinating and hematuria. Negative for dysuria.  Musculoskeletal: Positive for back pain and myalgias. Negative for gait problem (still able to walk with pain), neck pain and neck stiffness.  Skin: Negative for color change and rash.  Neurological: Positive for numbness (of left arm and right leg). Negative for dizziness, weakness, light-headedness and headaches.  Psychiatric/Behavioral: Positive for confusion.     Physical Exam Updated Vital Signs BP (!) 150/116 (BP Location: Right Arm)    Pulse (!) 51    Temp 98.1 F (36.7 C) (Oral)    Resp 20    SpO2 100%   Physical Exam Constitutional:      General: He is not in acute distress.    Appearance: He is obese. He is not ill-appearing.  HENT:     Head: Normocephalic.  Eyes:     Pupils: Pupils are equal, round, and reactive to light.  Neck:     Musculoskeletal: Neck supple.  Cardiovascular:     Rate and Rhythm: Normal rate and regular rhythm.     Pulses: Normal pulses.     Heart sounds: Normal heart sounds. No murmur. No friction rub. No gallop.   Pulmonary:     Effort: Pulmonary effort is normal.     Breath sounds: Normal breath sounds.  Abdominal:     General: Abdomen is flat. Bowel sounds are normal. There is no distension.     Palpations: Abdomen is soft.    Musculoskeletal:     Comments: Tenderness to palpation in right lower lumbar region. No midline tenderness. Limited hip flexion and extension due to pain.   Skin:    General: Skin is warm.  Psychiatric:        Mood and Affect: Mood normal.        Behavior: Behavior normal.   Neurological: Mental Status: Alert, oriented, thought content appropriate. Speech fluent without evidence of aphasia. Able to follow 2 step commands without difficulty.  Cranial Nerves:  III,IV, VI: pupils equal, round, reactive to light, ptosis not present, extra-ocular motions intact bilaterally  V,VII: smile symmetric, facial light touch sensation equal VIII: hearing grossly normal bilaterally  IX,X: midline uvula rise  XI: bilateral shoulder shrug equal and strong XII: midline tongue extension  Motor:  5/5 in upper bilaterally including strong and equal grip strength 5/5 in left lower extremity 4/5  strength in right lower extremity dorsiflexion/plantar flexion present bilaterally Sensory:  Upper: decreased left upper extremity sensation; normal right upper extremity sensation Lower:decreased in right lower extremity ; normal left lower extremity sensation Deep Tendon Reflexes: 2+ and symmetric throughout Cerebellar: normal finger-to-nose with bilateral upper extremities Gait: normal gait and balance    ED Treatments / Results  Labs (all labs ordered are listed, but only abnormal results are displayed) Labs Reviewed  BASIC METABOLIC PANEL - Abnormal; Notable for the following components:      Result Value   Glucose, Bld 118 (*)    Creatinine, Ser 1.53 (*)    GFR calc non Af Amer 59 (*)    All other components within normal limits  CBC - Abnormal; Notable for the following components:   RBC 5.83 (*)    Hemoglobin 17.1 (*)    All other components within normal limits  URINALYSIS, ROUTINE W REFLEX MICROSCOPIC    EKG None  Radiology Dg Lumbar Spine Complete  Result Date: 06/28/2019 CLINICAL  DATA:  Low back pain EXAM: LUMBAR SPINE - COMPLETE 4+ VIEW COMPARISON:  None. FINDINGS: Lumbar alignment within normal limits. Vertebral body heights are maintained. Mild disc space narrowing L3-L4 with small osteophytes. IMPRESSION: Minimal degenerative change.  No acute osseous abnormality. Electronically Signed   By: Jasmine Pang M.D.   On: 06/28/2019 18:29   Ct Head Wo Contrast  Result Date: 06/28/2019 CLINICAL DATA:  Low back pain.  Blood in urine for 3 days. EXAM: CT HEAD WITHOUT CONTRAST TECHNIQUE: Contiguous axial images were obtained from the base of the skull through the vertex without intravenous contrast. COMPARISON:  August 29, 2017 FINDINGS: Brain: No evidence of acute infarction, hemorrhage, hydrocephalus, extra-axial collection or mass lesion/mass effect. Vascular: No hyperdense vessel or unexpected calcification. Skull: Normal. Negative for fracture or focal lesion. Sinuses/Orbits: No acute finding. Other: None. IMPRESSION: No acute intracranial abnormalities are identified to explain the patient's symptoms. Electronically Signed   By: Gerome Sam III M.D   On: 06/28/2019 18:48   Ct Renal Stone Study  Result Date: 06/28/2019 CLINICAL DATA:  Low back pain and bloody urine for 3 days. EXAM: CT ABDOMEN AND PELVIS WITHOUT CONTRAST TECHNIQUE: Multidetector CT imaging of the abdomen and pelvis was performed following the standard protocol without IV contrast. COMPARISON:  April 17, 2015 FINDINGS: Lower chest: No acute abnormality. Hepatobiliary: No focal liver abnormality is seen. Status post cholecystectomy. No biliary dilatation. Pancreas: Unremarkable. No pancreatic ductal dilatation or surrounding inflammatory changes. Spleen: Normal in size without focal abnormality. Adrenals/Urinary Tract: Adrenal glands are unremarkable. Kidneys are normal, without renal calculi, focal lesion, or hydronephrosis. Bladder is unremarkable. Stomach/Bowel: The stomach and small bowel are normal. Moderate  fecal loading is seen throughout the length of the colon the patient appears to be status post appendectomy. Vascular/Lymphatic: No significant vascular findings are present. No enlarged abdominal or pelvic lymph nodes. Reproductive: Prostate is unremarkable. Other: There is a fat containing right inguinal hernia. Musculoskeletal: No acute or significant osseous findings. IMPRESSION: 1. No renal stone or obstruction. No cause for hematuria identified. 2. Moderate fecal loading throughout the length of the colon. 3. Fat containing right inguinal hernia. Electronically Signed   By: Gerome Sam III M.D   On: 06/28/2019 18:55    Procedures Procedures (including critical care time)  Medications Ordered in ED Medications  HYDROcodone-acetaminophen (NORCO/VICODIN) 5-325 MG per tablet 1 tablet (1 tablet Oral Given 06/28/19 1848)     Initial Impression / Assessment and Plan /  ED Course  I have reviewed the triage vital signs and the nursing notes.  Pertinent labs & imaging results that were available during my care of the patient were reviewed by me and considered in my medical decision making (see chart for details).  Daleon Willinger is a 33 year old male who presents to the ED with multiple issues first being right-sided low back pain after an injury associated with hematuria and second increased delirium/confusion and memory issues for the past few days. Physical exam was most noted for decreased sensation of left arm and right lower extremity and 4/5 strength of LLE, with all other neurological components unremarkable. Patient is non-ill appearing and in no acute distress.Will obtain head CT due to increased delirium and confusion. Will obtain renal stone study due to low back pain and hematuria. Also, DG lumbar spine ordered to check for any bony fractures caused by the injury.  Patient has no fever, memory loss/confusion was not acute and happened gradually over the past few days. UA illustrated no  signs of infection. DG of lumbar spine showed no signs of bony fractures. No critical lab values found. Head CT displayed no abnormalities that could account for the confusion and memory issues. Renal study displayed no signs of stone or hydronephrosis. The patient's hematuria stopped 1.5 days ago and UA was negative for blood today in the ED. Patient's elevated BP likely due to severe back pain. Patient is asymptomatic with no headaches, blurry vision, or chest pain. No signs of hypertensive urgency.  Spoke to Dr. Anitra Lauth about patient's case. Agreed to discharge patient home with close follow-up to neurology on Monday. This is likely a flare up of his usual TBI symptoms. Don't suspect acute stroke due to gradual progression. Hematuria has completely resolved. Suspect muscular etiology of back pain. Patient had no back pain red flags: no urinary retention, no saddle paresthesias, not a IV drug user, and no fever. Will send patient home with muscle relaxer for pain relief. Discussed risk of muscle relaxer with patient. Patient understands. Discussed return precautions to patient. Patient understands.    Final Clinical Impressions(s) / ED Diagnoses   Final diagnoses:  Acute right-sided low back pain without sciatica  Abnormal urination  Confusion    ED Discharge Orders         Ordered    cyclobenzaprine (FLEXERIL) 10 MG tablet  2 times daily PRN     06/28/19 1916           Lorelle Formosa 06/28/19 1941    Lorelle Formosa 06/29/19 1325    Gwyneth Sprout, MD 06/29/19 1849

## 2019-06-28 NOTE — ED Notes (Signed)
Pt denies any HA at this time, but has hx of concussive sx.  Sister stated that today he was mumbling and not making sense today.  Pt alert and oriented at this time, but on arrival was foggy and could not remember DOB.  States he feels more clear, but "buzzed".

## 2019-06-28 NOTE — ED Notes (Signed)
Patient verbalizes understanding of discharge instructions. Opportunity for questioning and answers were provided. Armband removed by staff, pt discharged from ED.  

## 2019-06-28 NOTE — ED Triage Notes (Signed)
Pt presents with low back/abd pain and bloody urine x3 days. States his niece jumped on his back with her knee.

## 2019-06-28 NOTE — Discharge Instructions (Addendum)
-  You are being sent home with a muscle relaxer called Flexeril. You can take this up to 2 times a day. The medication can make you drowsy, so do not operate machinery or go to work while on medication.  -Please follow up with your PCP in the next week if your back pain does not improve or if you see more blood in your urine.  -Call [facility] on Monday to schedule an appointment with them to get further workup on memory loss and confusion issues -Return to the ED if you develop worsening back pain, excessive blood in urine, or worsening memory/confusion.

## 2019-07-13 ENCOUNTER — Emergency Department (HOSPITAL_COMMUNITY)
Admission: EM | Admit: 2019-07-13 | Discharge: 2019-07-13 | Disposition: A | Discharge status: Home / Self Care | Payer: Self-pay | Attending: Emergency Medicine | Admitting: Emergency Medicine

## 2019-07-13 ENCOUNTER — Other Ambulatory Visit: Payer: Self-pay

## 2019-07-13 DIAGNOSIS — I10 Essential (primary) hypertension: Secondary | ICD-10-CM | POA: Insufficient documentation

## 2019-07-13 DIAGNOSIS — M545 Low back pain, unspecified: Secondary | ICD-10-CM

## 2019-07-13 DIAGNOSIS — K047 Periapical abscess without sinus: Secondary | ICD-10-CM | POA: Insufficient documentation

## 2019-07-13 DIAGNOSIS — Z79899 Other long term (current) drug therapy: Secondary | ICD-10-CM | POA: Insufficient documentation

## 2019-07-13 MED ORDER — DICLOFENAC SODIUM 1 % TD GEL: 4.0000 g | Freq: Four times a day (QID) | TRANSDERMAL | 0 refills | Status: DC

## 2019-07-13 MED ORDER — LIDOCAINE 5 % EX PTCH: 1.0000 | MEDICATED_PATCH | CUTANEOUS | 0 refills | Status: DC

## 2019-07-13 MED ORDER — PENICILLIN V POTASSIUM 500 MG PO TABS: 500.0000 mg | ORAL_TABLET | Freq: Four times a day (QID) | ORAL | 0 refills | Status: AC

## 2019-07-13 MED ORDER — CYCLOBENZAPRINE HCL 10 MG PO TABS: 10.0000 mg | ORAL_TABLET | Freq: Two times a day (BID) | ORAL | 0 refills | Status: DC | PRN

## 2019-07-13 MED ORDER — PENICILLIN V POTASSIUM 500 MG PO TABS: 500.0000 mg | ORAL_TABLET | Freq: Four times a day (QID) | ORAL | 0 refills | Status: DC

## 2019-07-13 NOTE — ED Triage Notes (Signed)
Pt in with c/o low back pain after fall 2 wks ago. Also c/o two mouth abcesses

## 2019-07-13 NOTE — Discharge Instructions (Signed)
Take full course of antibiotics.  Please use Flexeril for back pain.  Please follow-up with primary care. Gentle exercise and stretching for back pain.  Continue warm saline water mouth rinses and practice good oral hygiene. Follow up with dentist as soon as possible.

## 2019-07-13 NOTE — ED Provider Notes (Signed)
[facility] Provider Note   CSN: [id number] Arrival date & time: 07/13/19  1420     History   Chief Complaint Chief Complaint  Patient presents with  . Back Pain  . mouth abcess    HPI Alfred LeydenDustin Avila is a 33 y.o. male history of TBI, chronic dental caries, hypertension.  Primary complaint today is two mouth abscesses. Patient states for the past 4 years he has used azithromycin or penicillin every 6 months for recurrent abscesses.  Patient states that he has not been prescribed these antibiotics but has received them via mail order from [place]. Patient states he has no dental insurance at this time but states he is in the process of getting a dentist. 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  Patient states that he has used penicillin VK in the past with best results.   Patient also presents today for right-sided back pain that he has had for 2 weeks since his niece jumped on his back causing sharp pain that has been constant with mild improvement since. States this pain is worse with movement and lifting.  Patient states that he initially felt there was some swelling but that this is improved since he was last seen.  Denies any bowel or bladder incontinence, saddle anesthesia, numbness, weakness in legs, headache dizziness, neurologic changes.  Denies any fevers or chills.  Denies any IV drug use.    HPI  Past Medical History:  Diagnosis Date  . Chronic back pain   . Essential hypertension   . Gastric ulcer   . History of blood transfusion   . Migraines     Patient Active Problem List   Diagnosis Date Noted  . Abnormal CT of brain 08/30/2017  . Hypertensive urgency 08/29/2017  . Hypertensive emergency 06/22/2017  . Symptomatic anemia 05/01/2017  . Upper GI bleed 05/01/2017  . Elevated  troponin 05/01/2017  . Hyperglycemia 05/01/2017  . Essential hypertension 05/01/2017    Past Surgical History:  Procedure Laterality Date  . APPENDECTOMY    . CHOLECYSTECTOMY    . COLONOSCOPY    . ESOPHAGOGASTRODUODENOSCOPY N/A 05/02/2017   Procedure: ESOPHAGOGASTRODUODENOSCOPY (EGD);  Surgeon: Malissa Hippoehman, Najeeb U, MD;  Location: [facility];  Service: Endoscopy;  Laterality: N/A;        Home Medications    Prior to Admission medications   Medication Sig Start Date End Date Taking? Authorizing Provider  albuterol (PROVENTIL HFA;VENTOLIN HFA) 108 (90 Base) MCG/ACT inhaler Inhale 1 puff into the lungs every 6 (six) hours as needed for wheezing or shortness of breath.    [provider]  amLODipine (NORVASC) 10 MG tablet Take 1 tablet (10 mg total) by mouth daily. 06/16/19   Grayce SessionsEdwards, Michelle P, NP  atorvastatin (LIPITOR) 20 MG tablet Take 1 tablet (20 mg total) by mouth daily. 06/17/19   Grayce SessionsEdwards, Michelle P, NP  cyclobenzaprine (FLEXERIL) 10 MG tablet Take 1 tablet (10 mg total) by mouth 2 (two) times daily as needed for muscle spasms. 07/13/19   Gailen ShelterFondaw, Alexiana Laverdure S, PA  diclofenac sodium (VOLTAREN) 1 % GEL Apply 4 g topically 4 (four) times daily. 07/13/19   Estle Sabella, Rodrigo RanWylder S, PA  fluticasone (FLONASE) 50 MCG/ACT nasal spray Place 1 spray into both nostrils daily as needed (seasonal allergies).    [provider]  lidocaine (LIDODERM) 5 % Place  1 patch onto the skin daily. Remove & Discard patch within 12 hours or as directed by MD 07/13/19   Tedd Sias, PA  lisinopril-hydrochlorothiazide (ZESTORETIC) 20-25 MG tablet Take 1 tablet by mouth daily. 06/16/19   Kerin Perna, NP  metoprolol tartrate (LOPRESSOR) 50 MG tablet Take 0.5 tablets (25 mg total) by mouth 2 (two) times daily. 06/16/19 06/15/20  Kerin Perna, NP  Multiple Vitamin (MULTIVITAMIN WITH MINERALS) TABS tablet Take 1 tablet by mouth daily.    [provider]  ondansetron (ZOFRAN-ODT) 4 MG  disintegrating tablet Take by mouth. 11/04/18   [provider]  penicillin v potassium (VEETID) 500 MG tablet Take 1 tablet (500 mg total) by mouth 4 (four) times daily for 7 days. 07/13/19 07/20/19  Tedd Sias, PA  PRESCRIPTION MEDICATION Take 0.5 tablets by mouth 2 (two) times daily. metoprolol    [provider]  traZODone (DESYREL) 50 MG tablet Take 0.5-1 tablets (25-50 mg total) by mouth at bedtime as needed for sleep. 06/16/19   Kerin Perna, NP    Family History No family history on file.  Social History Social History   Tobacco Use  . Smoking status: Never Smoker  . Smokeless tobacco: Never Used  . Tobacco comment: cannabinoid oil (CBD oil) - 3-4 times daily  Substance Use Topics  . Alcohol use: Yes    Comment: denies use since prior "ulcer"  . Drug use: No     Allergies   Tetanus toxoids   Review of Systems Review of Systems  All other systems reviewed and are negative.    Physical Exam Updated Vital Signs BP (!) 159/109   Pulse 78   Temp 98.3 F (36.8 C) (Oral)   Resp 16   Ht 6\' 6"  (1.981 m)   Wt (!) 149.7 kg   SpO2 100%   BMI 38.14 kg/m   Physical Exam Vitals signs and nursing note reviewed.  Constitutional:      General: He is not in acute distress. HENT:     Head: Normocephalic and atraumatic.     Nose: Nose normal.  Eyes:     General: No scleral icterus. Neck:     Musculoskeletal: Normal range of motion.     Comments: No midline tenderness. Cardiovascular:     Rate and Rhythm: Normal rate and regular rhythm.     Pulses: Normal pulses.     Heart sounds: Normal heart sounds.  Pulmonary:     Effort: Pulmonary effort is normal. No respiratory distress.     Breath sounds: No wheezing.  Abdominal:     Palpations: Abdomen is soft.     Tenderness: There is no abdominal tenderness.  Musculoskeletal:     Right lower leg: No edema.     Left lower leg: No edema.     Comments: With right-sided lumbar muscular pain  with paravertebral spasm.  No midline tenderness of back.  Skin:    General: Skin is warm and dry.     Capillary Refill: Capillary refill takes less than 2 seconds.  Neurological:     Mental Status: He is alert and oriented to person, place, and time. Mental status is at baseline.     Comments: Sensation intact bilateral lower extremities.  Intact reflexes patellar and ankle.  Strength 5/5 in bilateral lower extremities with flexion extension of knee and hip as well as duction and abduction of hips.  Patient has pain with standing and sitting however is able to ambulate  without pain and states that he is able to walk out support.  Psychiatric:        Mood and Affect: Mood normal.        Behavior: Behavior normal.      ED Treatments / Results  Labs (all labs ordered are listed, but only abnormal results are displayed) Labs Reviewed - No data to display  EKG None  Radiology No results found.  Procedures Procedures (including critical care time)  Medications Ordered in ED Medications - No data to display   Initial Impression / Assessment and Plan / ED Course  I have reviewed the triage vital signs and the nursing notes.  Pertinent labs & imaging results that were available during my care of the patient were reviewed by me and considered in my medical decision making (see chart for details).       Patient is a 33 year old male with history of back pain after injury that occurred 2 weeks ago as well as dental abscess.   Patient is well-appearing with singular periapical abscess mid to lower right incisor is tender to palpation with no dental pain with percussion of tooth.  Patient has had similar abscesses in the past and has generally poor dentition.  Patient is understanding of need for dental follow-up and states that he will be able to in the future.  Patient has reassuring physical exam doubt Ludewig's or other serious etiology of mouth infection.  Patient given penicillin  and instructed to continue doing mouth rinses.  For back pain.  Patient denies any red flags symptoms.  Has reassuring neurologic exam and is able to walk.  Etiology is most likely musculoskeletal as patient had onset after his niece jumped on his back.  Patient had normal lumbar x-ray at last visit and states that he has been improving since he was seen in the ED 2 weeks ago.  Represcribed Flexeril as patient had relief with this and states that he would like to continue using that as night.  Patient will continue this time and ibuprofen for pain. Patient is understanding of plan.  Understands return precautions.  Discussed blood pressure elevation with patient and importance of following up with primary care for blood pressure recheck.  Patient is on medication states he is taking it today.  Patient denies any chest pain or headache no evidence of need to acutely lower blood pressure during ED visit today.   The patient appears reasonably screened and/or stabilized for discharge and I doubt any other medical condition or other [facility] requiring further screening, evaluation, or treatment in the ED at this time prior to discharge.  Patient is hemodynamically stable, in NAD, and able to ambulate in the ED. Pain has been managed or a plan has been made for home management and has no complaints prior to discharge. Patient is comfortable with above plan and is stable for discharge at this time. All questions were answered prior to disposition. Results from the ER workup discussed with the patient face to face and all questions answered to the best of my ability. The patient is safe for discharge with strict return precautions. Patient appears safe for discharge with appropriate follow-up.  Conveyed my impression with the patient and he voiced understanding and is agreeable to plan.  Or elevated during ED visit.  Decreased on recheck however is still elevated.    An After Visit Summary was printed and given to  the patient.  Portions of this note were generated with Dragon dictation  software. Dictation errors may occur despite best attempts at proofreading.      Final Clinical Impressions(s) / ED Diagnoses   Final diagnoses:  Acute right-sided low back pain without sciatica  Periapical abscess    ED Discharge Orders         Ordered    penicillin v potassium (VEETID) 500 MG tablet  4 times daily,   Status:  Discontinued     07/13/19 1623    cyclobenzaprine (FLEXERIL) 10 MG tablet  2 times daily PRN,   Status:  Discontinued     07/13/19 1623    diclofenac sodium (VOLTAREN) 1 % GEL  4 times daily,   Status:  Discontinued     07/13/19 1623    lidocaine (LIDODERM) 5 %  Every 24 hours,   Status:  Discontinued     07/13/19 1623    cyclobenzaprine (FLEXERIL) 10 MG tablet  2 times daily PRN     07/13/19 1640    diclofenac sodium (VOLTAREN) 1 % GEL  4 times daily     07/13/19 1640    lidocaine (LIDODERM) 5 %  Every 24 hours     07/13/19 1640    penicillin v potassium (VEETID) 500 MG tablet  4 times daily     07/13/19 1640           Gailen Shelter, PA 07/14/19 0034    Virgina Norfolk, DO 07/14/19 1537

## 2019-09-08 ENCOUNTER — Other Ambulatory Visit: Payer: Self-pay

## 2019-09-08 ENCOUNTER — Ambulatory Visit (INDEPENDENT_AMBULATORY_CARE_PROVIDER_SITE_OTHER): Payer: Self-pay | Admitting: Primary Care

## 2019-09-08 ENCOUNTER — Encounter (INDEPENDENT_AMBULATORY_CARE_PROVIDER_SITE_OTHER): Payer: Self-pay | Admitting: Primary Care

## 2019-09-08 DIAGNOSIS — R05 Cough: Secondary | ICD-10-CM

## 2019-09-08 DIAGNOSIS — I10 Essential (primary) hypertension: Secondary | ICD-10-CM

## 2019-09-08 DIAGNOSIS — B9789 Other viral agents as the cause of diseases classified elsewhere: Secondary | ICD-10-CM

## 2019-09-08 DIAGNOSIS — J069 Acute upper respiratory infection, unspecified: Secondary | ICD-10-CM

## 2019-09-08 DIAGNOSIS — R059 Cough, unspecified: Secondary | ICD-10-CM

## 2019-09-08 MED ORDER — FLUTICASONE PROPIONATE 50 MCG/ACT NA SUSP: 1.0000 | Freq: Every day | NASAL | 1 refills | Status: DC | PRN

## 2019-09-08 MED ORDER — DM-GUAIFENESIN ER 30-600 MG PO TB12: 1.0000 | ORAL_TABLET | Freq: Two times a day (BID) | ORAL | 1 refills | Status: DC

## 2019-09-08 MED ORDER — LISINOPRIL-HYDROCHLOROTHIAZIDE 20-25 MG PO TABS: 1.0000 | ORAL_TABLET | Freq: Every day | ORAL | 0 refills | Status: DC

## 2019-09-08 MED ORDER — ALBUTEROL SULFATE HFA 108 (90 BASE) MCG/ACT IN AERS: 2.0000 | INHALATION_SPRAY | Freq: Four times a day (QID) | RESPIRATORY_TRACT | 1 refills | Status: DC | PRN

## 2019-09-08 MED ORDER — AMLODIPINE BESYLATE 10 MG PO TABS: 10.0000 mg | ORAL_TABLET | Freq: Every day | ORAL | 1 refills | Status: DC

## 2019-09-08 MED ORDER — AZITHROMYCIN 250 MG PO TABS: ORAL_TABLET | ORAL | 0 refills | Status: DC

## 2019-09-08 NOTE — Progress Notes (Signed)
Virtual Visit via Telephone Note  I connected with Pamalee Leyden on 09/08/19 at  2:50 PM EST by telephone and verified that I am speaking with the correct person using two identifiers.   I discussed the limitations, risks, security and privacy concerns of performing an evaluation and management service by telephone and the availability of in person appointments. I also discussed with the patient that there may be a patient responsible charge related to this service. The patient expressed understanding and agreed to proceed.   History of Present Illness: Mr. Alfons Sulkowski is having an acute tele visit for respiratory symptoms.Noted in ROS.  Past Medical History:  Diagnosis Date  . Chronic back pain   . Essential hypertension   . Gastric ulcer   . History of blood transfusion   . Migraines    Current Outpatient Medications on File Prior to Visit  Medication Sig Dispense Refill  . atorvastatin (LIPITOR) 20 MG tablet Take 1 tablet (20 mg total) by mouth daily. 90 tablet 3  . cyclobenzaprine (FLEXERIL) 10 MG tablet Take 1 tablet (10 mg total) by mouth 2 (two) times daily as needed for muscle spasms. 20 tablet 0  . diclofenac sodium (VOLTAREN) 1 % GEL Apply 4 g topically 4 (four) times daily. 50 g 0  . lidocaine (LIDODERM) 5 % Place 1 patch onto the skin daily. Remove & Discard patch within 12 hours or as directed by MD 30 patch 0  . metoprolol tartrate (LOPRESSOR) 50 MG tablet Take 0.5 tablets (25 mg total) by mouth 2 (two) times daily. 180 tablet 0  . Multiple Vitamin (MULTIVITAMIN WITH MINERALS) TABS tablet Take 1 tablet by mouth daily.    . ondansetron (ZOFRAN-ODT) 4 MG disintegrating tablet Take by mouth.    Marland Kitchen PRESCRIPTION MEDICATION Take 0.5 tablets by mouth 2 (two) times daily. metoprolol    . traZODone (DESYREL) 50 MG tablet Take 0.5-1 tablets (25-50 mg total) by mouth at bedtime as needed for sleep. 30 tablet 3   No current facility-administered medications on file prior to visit.      Observations/Objective: Review of Systems  Constitutional: Positive for chills and fever.  Respiratory: Positive for cough, sputum production and shortness of breath.        Phlegm orange/rust    Assessment and Plan: Topher was seen today for post-traumatic stress disorder.  Diagnoses and all orders for this visit: Derrin was seen today for post-traumatic stress disorder.  Diagnoses and all orders for this visit:  Acute upper respiratory infection Treat with abt for respiratory infection , increase fluids if increase coughing, shortness of breath , lost of taste, or COVID  Go to be tested   Cough  dextromethorphan-guaiFENesin (MUCINEX DM) 30-600 MG 12hr tablet; Take 1 tablet by mouth 2 (two) times daily.    Essential hypertension -     amLODipine (NORVASC) 10 MG tablet; Take 1 tablet (10 mg total) by mouth daily.  Other orders -     lisinopril-hydrochlorothiazide (ZESTORETIC) 20-25 MG tablet; Take 1 tablet by mouth daily. -     fluticasone (FLONASE) 50 MCG/ACT nasal spray; Place 1 spray into both nostrils daily as needed (seasonal allergies). -     albuterol (VENTOLIN HFA) 108 (90 Base) MCG/ACT inhaler; Inhale 2 puffs into the lungs every 6 (six) hours as needed for wheezing or shortness of breath. -     azithromycin (ZITHROMAX) 250 MG tablet; Take 2 tablets day 1 than for the next 5 days take 1 tablet daily -  Follow Up Instructions:    I discussed the assessment and treatment plan with the patient. The patient was provided an opportunity to ask questions and all were answered. The patient agreed with the plan and demonstrated an understanding of the instructions.   The patient was advised to call back or seek an in-person evaluation if the symptoms worsen or if the condition fails to improve as anticipated.  I provided 15 minutes of non-face-to-face time during this encounter.   Kerin Perna, NP

## 2019-10-24 ENCOUNTER — Other Ambulatory Visit (INDEPENDENT_AMBULATORY_CARE_PROVIDER_SITE_OTHER): Payer: Self-pay | Admitting: Primary Care

## 2020-02-25 ENCOUNTER — Encounter (HOSPITAL_COMMUNITY): Payer: Self-pay

## 2020-02-25 ENCOUNTER — Emergency Department (HOSPITAL_COMMUNITY): Payer: Self-pay

## 2020-02-25 ENCOUNTER — Other Ambulatory Visit: Payer: Self-pay

## 2020-02-25 ENCOUNTER — Emergency Department (HOSPITAL_COMMUNITY)
Admission: EM | Admit: 2020-02-25 | Discharge: 2020-02-25 | Disposition: A | Discharge status: Home / Self Care | Payer: Self-pay | Attending: Emergency Medicine | Admitting: Emergency Medicine

## 2020-02-25 DIAGNOSIS — I10 Essential (primary) hypertension: Secondary | ICD-10-CM | POA: Insufficient documentation

## 2020-02-25 DIAGNOSIS — Z79899 Other long term (current) drug therapy: Secondary | ICD-10-CM | POA: Insufficient documentation

## 2020-02-25 DIAGNOSIS — D739 Disease of spleen, unspecified: Secondary | ICD-10-CM | POA: Insufficient documentation

## 2020-02-25 DIAGNOSIS — D7389 Other diseases of spleen: Secondary | ICD-10-CM

## 2020-02-25 DIAGNOSIS — R1031 Right lower quadrant pain: Secondary | ICD-10-CM | POA: Insufficient documentation

## 2020-02-25 LAB — CBC
HCT: 50.8 % (ref 39.0–52.0)
Hemoglobin: 16.7 g/dL (ref 13.0–17.0)
MCH: 28.3 pg (ref 26.0–34.0)
MCHC: 32.9 g/dL (ref 30.0–36.0)
MCV: 86 fL (ref 80.0–100.0)
Platelets: 370 10*3/uL (ref 150–400)
RBC: 5.91 MIL/uL — ABNORMAL HIGH (ref 4.22–5.81)
RDW: 13.3 % (ref 11.5–15.5)
WBC: 6.7 10*3/uL (ref 4.0–10.5)
nRBC: 0 % (ref 0.0–0.2)

## 2020-02-25 LAB — COMPREHENSIVE METABOLIC PANEL
ALT: 46 U/L — ABNORMAL HIGH (ref 0–44)
AST: 47 U/L — ABNORMAL HIGH (ref 15–41)
Albumin: 3.8 g/dL (ref 3.5–5.0)
Alkaline Phosphatase: 76 U/L (ref 38–126)
Anion gap: 8 (ref 5–15)
BUN: 16 mg/dL (ref 6–20)
CO2: 25 mmol/L (ref 22–32)
Calcium: 9.1 mg/dL (ref 8.9–10.3)
Chloride: 105 mmol/L (ref 98–111)
Creatinine, Ser: 1.24 mg/dL (ref 0.61–1.24)
GFR calc Af Amer: >60 mL/min (ref >60)
GFR calc non Af Amer: >60 mL/min (ref >60)
Glucose, Bld: 96 mg/dL (ref 70–99)
Potassium: 4.7 mmol/L (ref 3.5–5.1)
Sodium: 138 mmol/L (ref 135–145)
Total Bilirubin: 1.1 mg/dL (ref 0.3–1.2)
Total Protein: 6.9 g/dL (ref 6.5–8.1)

## 2020-02-25 LAB — URINALYSIS, ROUTINE W REFLEX MICROSCOPIC
Bilirubin Urine: NEGATIVE
Glucose, UA: NEGATIVE mg/dL
Hgb urine dipstick: NEGATIVE
Ketones, ur: NEGATIVE mg/dL
Leukocytes,Ua: NEGATIVE
Nitrite: NEGATIVE
Protein, ur: NEGATIVE mg/dL
Specific Gravity, Urine: 1.014 (ref 1.005–1.030)
pH: 5 (ref 5.0–8.0)

## 2020-02-25 LAB — LIPASE, BLOOD: Lipase: 59 U/L — ABNORMAL HIGH (ref 11–51)

## 2020-02-25 MED ORDER — SODIUM CHLORIDE 0.9 % IV BOLUS
1000.0000 mL | Freq: Once | INTRAVENOUS | Status: AC
  Administered 2020-02-25: 1000 mL via INTRAVENOUS

## 2020-02-25 MED ORDER — IOHEXOL 300 MG/ML  SOLN
100.0000 mL | Freq: Once | INTRAMUSCULAR | Status: AC | PRN
  Administered 2020-02-25: 100 mL via INTRAVENOUS

## 2020-02-25 MED ORDER — CYCLOBENZAPRINE HCL 10 MG PO TABS: 10.0000 mg | ORAL_TABLET | Freq: Three times a day (TID) | ORAL | 0 refills | Status: DC | PRN

## 2020-02-25 MED ORDER — KETOROLAC TROMETHAMINE 30 MG/ML IJ SOLN
15.0000 mg | Freq: Once | INTRAMUSCULAR | Status: AC
  Administered 2020-02-25: 15 mg via INTRAVENOUS
  Filled 2020-02-25: qty 1

## 2020-02-25 NOTE — Discharge Instructions (Signed)
You are seen in the emergency department for 2 weeks of right lower quadrant abdominal pain.  No blood work urinalysis and a CAT scan of your abdomen and pelvis that did not show any obvious explanation for your pain.  Your CAT scan did show some findings in your spleen that will need to follow-up.  Please return to the emergency department for any worsening or concerning symptoms.  Below I have included the report of the CAT scan that you will need to inform your primary care doctor about.  IMPRESSION:  1. No CT evidence for acute intra-abdominal or pelvic abnormality.  2. Multiple subcentimeter hypodense splenic lesions, indeterminate  but probably benign. Suggest six-month follow-up MRI.

## 2020-02-25 NOTE — ED Triage Notes (Signed)
Pt reports RLQ pain since last Wednesday with some nausea. Tried OTC medications without relief.

## 2020-02-25 NOTE — ED Notes (Signed)
Patient given discharge instructions. Questions were answered. Patient verbalized understanding of discharge instructions and care at home.  

## 2020-02-25 NOTE — ED Provider Notes (Signed)
[facility] EMERGENCY DEPARTMENT Provider Note   CSN: [id number] Arrival date & time: 02/25/20  4401     History Chief Complaint  Patient presents with  . Abdominal Pain    Alfred Avila is a 34 y.o. male.  HPI    Patient presents with concern of abdominal pain. Pain is right-sided lower quadrant, sore, severe, 9/10. Pain is not improved with Tylenol, ibuprofen. Pain began without clear precipitant, about 1 week ago, and since that time has been persistent.  There is associated nausea, anorexia, but no change in bowel movements. No urinary complaints. Patient has a notable history of cholecystectomy, appendectomy.  Past Medical History:  Diagnosis Date  . Chronic back pain   . Essential hypertension   . Gastric ulcer   . History of blood transfusion   . Migraines     Patient Active Problem List   Diagnosis Date Noted  . Abnormal CT of brain 08/30/2017  . Hypertensive urgency 08/29/2017  . Hypertensive emergency 06/22/2017  . Symptomatic anemia 05/01/2017  . Upper GI bleed 05/01/2017  . Elevated troponin 05/01/2017  . Hyperglycemia 05/01/2017  . Essential hypertension 05/01/2017    Past Surgical History:  Procedure Laterality Date  . APPENDECTOMY    . CHOLECYSTECTOMY    . COLONOSCOPY    . ESOPHAGOGASTRODUODENOSCOPY N/A 05/02/2017   Procedure: ESOPHAGOGASTRODUODENOSCOPY (EGD);  Surgeon: Malissa Hippo, MD;  Location: [facility];  Service: Endoscopy;  Laterality: N/A;       No family history on file.  Social History   Tobacco Use  . Smoking status: Never Smoker  . Smokeless tobacco: Never Used  . Tobacco comment: cannabinoid oil (CBD oil) - 3-4 times daily  Substance Use Topics  . Alcohol use: Yes    Comment: denies use since prior "ulcer"  . Drug use: No    Home Medications Prior to Admission medications   Medication Sig Start Date End Date Taking? Authorizing Provider  albuterol (VENTOLIN HFA) 108 (90 Base) MCG/ACT  inhaler Inhale 2 puffs into the lungs every 6 (six) hours as needed for wheezing or shortness of breath. 09/08/19   Grayce Sessions, NP  amLODipine (NORVASC) 10 MG tablet Take 1 tablet (10 mg total) by mouth daily. 09/08/19   Grayce Sessions, NP  atorvastatin (LIPITOR) 20 MG tablet Take 1 tablet (20 mg total) by mouth daily. 06/17/19   Grayce Sessions, NP  azithromycin (ZITHROMAX) 250 MG tablet Take 2 tablets day 1 than for the next 5 days take 1 tablet daily 09/08/19   Grayce Sessions, NP  cyclobenzaprine (FLEXERIL) 10 MG tablet Take 1 tablet (10 mg total) by mouth 2 (two) times daily as needed for muscle spasms. 07/13/19   Gailen Shelter, PA  dextromethorphan-guaiFENesin (MUCINEX DM) 30-600 MG 12hr tablet Take 1 tablet by mouth 2 (two) times daily. 09/08/19   Grayce Sessions, NP  diclofenac sodium (VOLTAREN) 1 % GEL Apply 4 g topically 4 (four) times daily. 07/13/19   Fondaw, Rodrigo Ran, PA  fluticasone (FLONASE) 50 MCG/ACT nasal spray Place 1 spray into both nostrils daily as needed (seasonal allergies). 09/08/19   Grayce Sessions, NP  lidocaine (LIDODERM) 5 % Place 1 patch onto the skin daily. Remove & Discard patch within 12 hours or as directed by MD 07/13/19   Gailen Shelter, PA  lisinopril-hydrochlorothiazide (ZESTORETIC) 20-25 MG tablet Take 1 tablet by mouth daily. 09/08/19   Grayce Sessions, NP  metoprolol tartrate (LOPRESSOR) 50 MG tablet  Take 0.5 tablets (25 mg total) by mouth 2 (two) times daily. 06/16/19 06/15/20  Grayce Sessions, NP  Multiple Vitamin (MULTIVITAMIN WITH MINERALS) TABS tablet Take 1 tablet by mouth daily.    [provider]  ondansetron (ZOFRAN-ODT) 4 MG disintegrating tablet Take by mouth. 11/04/18   [provider]  PRESCRIPTION MEDICATION Take 0.5 tablets by mouth 2 (two) times daily. metoprolol    [provider]  traZODone (DESYREL) 50 MG tablet Take 0.5-1 tablets (25-50 mg total) by mouth at bedtime as needed for  sleep. 06/16/19   Grayce Sessions, NP    Allergies    Tetanus toxoids  Review of Systems   Review of Systems  Constitutional:       Per HPI, otherwise negative  HENT:       Per HPI, otherwise negative  Respiratory:       Per HPI, otherwise negative  Cardiovascular:       Per HPI, otherwise negative  Gastrointestinal: Positive for abdominal pain, nausea and vomiting. Negative for constipation and diarrhea.  Endocrine:       Negative aside from HPI  Genitourinary:       Neg aside from HPI   Musculoskeletal:       Per HPI, otherwise negative  Skin: Negative.   Neurological: Negative for syncope.    Physical Exam Updated Vital Signs BP (!) 193/118   Pulse 65   Temp 98 F (36.7 C)   Resp 18   Ht 6\' 6"  (1.981 m)   Wt (!) 154.2 kg   SpO2 100%   BMI 39.29 kg/m   Physical Exam Vitals and nursing note reviewed.  Constitutional:      General: He is not in acute distress.    Appearance: He is well-developed.  HENT:     Head: Normocephalic and atraumatic.  Eyes:     Conjunctiva/sclera: Conjunctivae normal.  Cardiovascular:     Rate and Rhythm: Normal rate and regular rhythm.  Pulmonary:     Effort: Pulmonary effort is normal. No respiratory distress.     Breath sounds: No stridor.  Abdominal:     General: There is no distension.     Tenderness: There is abdominal tenderness in the right lower quadrant.  Skin:    General: Skin is warm and dry.  Neurological:     Mental Status: He is alert and oriented to person, place, and time.     ED Results / Procedures / Treatments   Labs (all labs ordered are listed, but only abnormal results are displayed) Labs Reviewed  LIPASE, BLOOD - Abnormal; Notable for the following components:      Result Value   Lipase 59 (*)    All other components within normal limits  COMPREHENSIVE METABOLIC PANEL - Abnormal; Notable for the following components:   AST 47 (*)    ALT 46 (*)    All other components within normal limits    CBC - Abnormal; Notable for the following components:   RBC 5.91 (*)    All other components within normal limits  URINALYSIS, ROUTINE W REFLEX MICROSCOPIC    Procedures Procedures (including critical care time)  Medications Ordered in ED Medications  sodium chloride 0.9 % bolus 1,000 mL (1,000 mLs Intravenous New Bag/Given 02/25/20 1502)  ketorolac (TORADOL) 30 MG/ML injection 15 mg (15 mg Intravenous Given 02/25/20 1501)    ED Course  I have reviewed the triage vital signs and the nursing notes.  Pertinent labs & imaging  results that were available during my care of the patient were reviewed by me and considered in my medical decision making (see chart for details).     Update:, Initial results reassuring.  This adult male presents with several days of right lower quadrant abdominal pain. Notably, patient history of prior appendectomy, has no bowel movement changes, but does have nausea, suspicious for intestinal disorder.  On patient is new dysuria, no hematuria, lower suspicion for kidney stone or other urologic phenomena. Given the tenderness palpation, the patient does require CT abdomen pelvis to exclude the pathology, including abscess, infection, hematoma.  Patient has received initial analgesia in the ED, will likely require repeat evaluation after results of CT scan.   Dr. Melina Copa is aware of the patient. Final Clinical Impression(s) / ED Diagnoses Final diagnoses:  Right lower quadrant abdominal pain     Carmin Muskrat, MD 02/25/20 1559

## 2020-02-25 NOTE — ED Provider Notes (Signed)
Signout from Dr. Jeraldine Loots.  34 year old male here with severe right-sided lower abdominal pain.  Labs are unremarkable.  Patient is had a appendectomy in the past.  Pending a abdominal and pelvis CT.  Disposition per results of testing. Physical Exam  BP (!) 165/101 (BP Location: Right Arm)   Pulse 62   Temp 98 F (36.7 C)   Resp 16   Ht 6\' 6"  (1.981 m)   Wt (!) 154.2 kg   SpO2 99%   BMI 39.29 kg/m   Physical Exam  ED Course/Procedures     Procedures  MDM  CT abdomen pelvis showing no obvious explanation for right lower quadrant pain.  Radiology does comment upon some splenic lesions that will need follow-up imaging.  Reviewed this with the patient.  He asked if we could prescribe him some Flexeril.  Return instructions discussed       , MD 02/26/20 [phone]

## 2020-05-05 ENCOUNTER — Telehealth (INDEPENDENT_AMBULATORY_CARE_PROVIDER_SITE_OTHER): Payer: Self-pay | Admitting: Primary Care

## 2020-05-05 NOTE — Telephone Encounter (Signed)
Medication Refill - Medication:   Has the patient contacted their pharmacy? No. (Agent: If no, request that the patient contact the pharmacy for the refill.)Different pharmacy for this time. (Agent: If yes, when and what did the pharmacy advise?)  Preferred Pharmacy (with phone number or street name):[facility] [address] [place] [address]  Agent: Please be advised that RX refills may take up to 3 business days. We ask that you follow-up with your pharmacy.

## 2020-05-07 NOTE — Telephone Encounter (Signed)
Left message asking patient to call office and schedule appointment.

## 2020-05-14 ENCOUNTER — Encounter (INDEPENDENT_AMBULATORY_CARE_PROVIDER_SITE_OTHER): Payer: Self-pay | Admitting: Primary Care

## 2020-05-14 ENCOUNTER — Other Ambulatory Visit: Payer: Self-pay

## 2020-05-14 ENCOUNTER — Ambulatory Visit (INDEPENDENT_AMBULATORY_CARE_PROVIDER_SITE_OTHER): Payer: Self-pay | Admitting: Primary Care

## 2020-05-14 VITALS — BP 137/94 | HR 64 | Ht 78.0 in | Wt 296.0 lb

## 2020-05-14 DIAGNOSIS — Z0000 Encounter for general adult medical examination without abnormal findings: Secondary | ICD-10-CM

## 2020-05-14 DIAGNOSIS — I10 Essential (primary) hypertension: Secondary | ICD-10-CM

## 2020-05-14 DIAGNOSIS — Z800 Family history of malignant neoplasm of digestive organs: Secondary | ICD-10-CM

## 2020-05-14 DIAGNOSIS — E782 Mixed hyperlipidemia: Secondary | ICD-10-CM

## 2020-05-14 MED ORDER — AMLODIPINE BESYLATE 10 MG PO TABS: 10.0000 mg | ORAL_TABLET | Freq: Every day | ORAL | 1 refills | Status: DC

## 2020-05-14 MED ORDER — ADULT MULTIVITAMIN W/MINERALS CH: 1.0000 | ORAL_TABLET | Freq: Every day | ORAL | 3 refills | Status: AC

## 2020-05-14 MED ORDER — LISINOPRIL-HYDROCHLOROTHIAZIDE 20-25 MG PO TABS: 1.0000 | ORAL_TABLET | Freq: Every day | ORAL | 0 refills | Status: DC

## 2020-05-14 MED ORDER — METOPROLOL TARTRATE 50 MG PO TABS: 25.0000 mg | ORAL_TABLET | Freq: Two times a day (BID) | ORAL | 1 refills | Status: DC

## 2020-05-14 MED ORDER — ADULT MULTIVITAMIN W/MINERALS CH: 1.0000 | ORAL_TABLET | Freq: Every day | ORAL | 3 refills | Status: DC

## 2020-05-14 MED FILL — LISINOPRIL-HYDROCHLOROTHIAZ: 20-25 | 30 days supply | Qty: 30 | Fill #0

## 2020-05-14 MED FILL — AMLODIPINE BESYLATE 10 MG T: 10 | 30 days supply | Qty: 30 | Fill #0

## 2020-05-14 MED FILL — METOPROLOL TARTRATE 50 MG T: 50 | 30 days supply | Qty: 30 | Fill #0

## 2020-05-14 NOTE — Patient Instructions (Signed)

## 2020-05-14 NOTE — Progress Notes (Signed)
   [facility]  Subjective:   Alfred Avila is  here for follow-up of elevated blood pressure.  He is exercising and is not adherent to a low-salt diet.  Blood pressure is not well controlled at home. Cardiac symptoms: none. Patient denies: chest pain, fatigue, palpitations and tachypnea. Cardiovascular risk factors: dyslipidemia, hypertension, male gender and obesity (BMI >/= 30 kg/m2). Use of agents associated with hypertension: none. History of target organ damage: none. He voices concerns with blood in stool will give FOBT. Has a family history of colon cancer   Review of Systems A comprehensive review of systems was negative.     Objective:   BP  137/94   Pulse 64   Ht 6\' 6"  (1.981 m)   Wt 296 lb (134.3 kg)   SpO2 98%   BMI 34.21 kg/m   Assessment:    Hypertension, stage 1  Evidence of target organ damage: none.    Plan:  Alfred Avila was seen today for medication refill and hypertension.  Diagnoses and all orders for this visit:  Healthcare maintenance -     Multiple Vitamin (MULTIVITAMIN WITH MINERALS) TABS tablet; Take 1 tablet by mouth daily.  Mixed hyperlipidemia Decrease your fatty foods, red meat, cheese, milk and increase fiber like whole grains and veggies. Is aware of increase risk of heart attack and stroke in combination of hypertension and is not taking cholesterol lowering medication at this time    Lipid panel; Future -      Essential hypertension Counseled on blood pressure goal of less than 130/80, low-sodium, DASH diet, medication compliance, 150 minutes of moderate intensity exercise per week. Discussed medication compliance, adverse effects. -     amLODipine (NORVASC) 10 MG tablet; Take 1 tablet (10 mg total) by mouth daily. -     metoprolol tartrate (LOPRESSOR) 50 MG tablet; Take 0.5 tablets (25 mg total) by mouth 2 (two) times daily. -     lisinopril-hydrochlorothiazide (ZESTORETIC) 20-25 MG tablet; Take 1 tablet by mouth  daily.  Comprehensive metabolic panel; Future  Family history of colon cancer FOBT he has bleed in stool   Alfred Hailey, NP

## 2020-07-15 ENCOUNTER — Other Ambulatory Visit (INDEPENDENT_AMBULATORY_CARE_PROVIDER_SITE_OTHER): Payer: Self-pay | Admitting: Primary Care

## 2020-07-15 DIAGNOSIS — I10 Essential (primary) hypertension: Secondary | ICD-10-CM

## 2020-07-15 NOTE — Telephone Encounter (Signed)
Copied from CRM [id number]. Topic: Quick Communication - Rx Refill/Question >> Jul 15, 2020  3:52 PM Mcneil, Jannifer Rodney wrote: Pt requests 3 month supply on the following if possible Medication: amLODipine (NORVASC) 10 MG tablet, lisinopril-hydrochlorothiazide (ZESTORETIC) 20-25 MG tablet, and metoprolol tartrate (LOPRESSOR) 50 MG tablet  Has the patient contacted their pharmacy? no  Preferred Pharmacy (with phone number or street name): [facility] [address], [place] - [address]  Phone: [phone]  Fax: [phone]  Agent: Please be advised that RX refills may take up to 3 business days. We ask that you follow-up with your pharmacy.

## 2020-11-06 ENCOUNTER — Other Ambulatory Visit (INDEPENDENT_AMBULATORY_CARE_PROVIDER_SITE_OTHER): Payer: Self-pay | Admitting: Primary Care

## 2020-11-06 DIAGNOSIS — I10 Essential (primary) hypertension: Secondary | ICD-10-CM

## 2020-11-06 NOTE — Telephone Encounter (Signed)
Future visit in 1 week . Overdue labs. Last labs on 02/25/20

## 2020-11-19 ENCOUNTER — Other Ambulatory Visit: Payer: Self-pay

## 2020-11-19 ENCOUNTER — Ambulatory Visit (INDEPENDENT_AMBULATORY_CARE_PROVIDER_SITE_OTHER): Payer: Self-pay | Admitting: Primary Care

## 2020-11-19 ENCOUNTER — Encounter (INDEPENDENT_AMBULATORY_CARE_PROVIDER_SITE_OTHER): Payer: Self-pay | Admitting: Primary Care

## 2020-11-19 VITALS — BP 114/86 | HR 68 | Temp 97.3°F | Ht 78.0 in | Wt 316.0 lb

## 2020-11-19 DIAGNOSIS — Z760 Encounter for issue of repeat prescription: Secondary | ICD-10-CM

## 2020-11-19 DIAGNOSIS — M5440 Lumbago with sciatica, unspecified side: Secondary | ICD-10-CM

## 2020-11-19 DIAGNOSIS — F4310 Post-traumatic stress disorder, unspecified: Secondary | ICD-10-CM

## 2020-11-19 DIAGNOSIS — I10 Essential (primary) hypertension: Secondary | ICD-10-CM

## 2020-11-19 MED ORDER — AMLODIPINE BESYLATE 10 MG PO TABS: 10.0000 mg | ORAL_TABLET | Freq: Every day | ORAL | 1 refills | Status: DC

## 2020-11-19 MED ORDER — LISINOPRIL-HYDROCHLOROTHIAZIDE 20-25 MG PO TABS: 1.0000 | ORAL_TABLET | Freq: Every day | ORAL | 1 refills | Status: DC

## 2020-11-19 MED ORDER — METOPROLOL TARTRATE 50 MG PO TABS: 25.0000 mg | ORAL_TABLET | Freq: Two times a day (BID) | ORAL | 1 refills | Status: DC

## 2020-11-19 MED ORDER — CYCLOBENZAPRINE HCL 10 MG PO TABS: 10.0000 mg | ORAL_TABLET | Freq: Three times a day (TID) | ORAL | 0 refills | Status: DC | PRN
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
## 2020-11-19 NOTE — Patient Instructions (Signed)
Acute Back Pain, Adult Acute back pain is sudden and usually short-lived. It is often caused by an injury to the muscles and tissues in the back. The injury may result from:  A muscle or ligament getting overstretched or torn (strained). Ligaments are tissues that connect bones to each other. Lifting something improperly can cause a back strain.  Wear and tear (degeneration) of the spinal disks. Spinal disks are circular tissue that provide cushioning between the bones of the spine (vertebrae).  Twisting motions, such as while playing sports or doing yard work.  A hit to the back.  Arthritis. You may have a physical exam, lab tests, and imaging tests to find the cause of your pain. Acute back pain usually goes away with rest and home care. Follow these instructions at home: Managing pain, stiffness, and swelling  Treatment may include medicines for pain and inflammation that are taken by mouth or applied to the skin, prescription pain medicine, or muscle relaxants. Take over-the-counter and prescription medicines only as told by your health care provider.  Your health care provider may recommend applying ice during the first 24-48 hours after your pain starts. To do this: ? Put ice in a plastic bag. ? Place a towel between your skin and the bag. ? Leave the ice on for 20 minutes, 2-3 times a day.  If directed, apply heat to the affected area as often as told by your health care provider. Use the heat source that your health care provider recommends, such as a moist heat pack or a heating pad. ? Place a towel between your skin and the heat source. ? Leave the heat on for 20-30 minutes. ? Remove the heat if your skin turns bright red. This is especially important if you are unable to feel pain, heat, or cold. You have a greater risk of getting burned. Activity  Do not stay in bed. Staying in bed for more than 1-2 days can delay your recovery.  Sit up and stand up straight. Avoid leaning  forward when you sit or hunching over when you stand. ? If you work at a desk, sit close to it so you do not need to lean over. Keep your chin tucked in. Keep your neck drawn back, and keep your elbows bent at a 90-degree angle (right angle). ? Sit high and close to the steering wheel when you drive. Add lower back (lumbar) support to your car seat, if needed.  Take short walks on even surfaces as soon as you are able. Try to increase the length of time you walk each day.  Do not sit, drive, or stand in one place for more than 30 minutes at a time. Sitting or standing for long periods of time can put stress on your back.  Do not drive or use heavy machinery while taking prescription pain medicine.  Use proper lifting techniques. When you bend and lift, use positions that put less stress on your back: ? Bend your knees. ? Keep the load close to your body. ? Avoid twisting.  Exercise regularly as told by your health care provider. Exercising helps your back heal faster and helps prevent back injuries by keeping muscles strong and flexible.  Work with a physical therapist to make a safe exercise program, as recommended by your health care provider. Do any exercises as told by your physical therapist.   Lifestyle  Maintain a healthy weight. Extra weight puts stress on your back and makes it difficult to have   good posture.  Avoid activities or situations that make you feel anxious or stressed. Stress and anxiety increase muscle tension and can make back pain worse. Learn ways to manage anxiety and stress, such as through exercise. General instructions  Sleep on a firm mattress in a comfortable position. Try lying on your side with your knees slightly bent. If you lie on your back, put a pillow under your knees.  Follow your treatment plan as told by your health care provider. This may include: ? Cognitive or behavioral therapy. ? Acupuncture or massage therapy. ? Meditation or yoga. Contact  a health care provider if:  You have pain that is not relieved with rest or medicine.  You have increasing pain going down into your legs or buttocks.  Your pain does not improve after 2 weeks.  You have pain at night.  You lose weight without trying.  You have a fever or chills. Get help right away if:  You develop new bowel or bladder control problems.  You have unusual weakness or numbness in your arms or legs.  You develop nausea or vomiting.  You develop abdominal pain.  You feel faint. Summary  Acute back pain is sudden and usually short-lived.  Use proper lifting techniques. When you bend and lift, use positions that put less stress on your back.  Take over-the-counter and prescription medicines and apply heat or ice as directed by your health care provider. This information is not intended to replace advice given to you by your health care provider. Make sure you discuss any questions you have with your health care provider. Document Revised: 06/11/2020 Document Reviewed: 06/11/2020 [employer] Patient Education  2021 [employer]  

## 2020-11-20 LAB — CBC WITH DIFFERENTIAL/PLATELET
Basophils Absolute: 0.1 10*3/uL (ref 0.0–0.2)
Basos: 1 %
EOS (ABSOLUTE): 0.3 10*3/uL (ref 0.0–0.4)
Eos: 4 %
Hematocrit: 54.9 % — ABNORMAL HIGH (ref 37.5–51.0)
Hemoglobin: 18.5 g/dL — ABNORMAL HIGH (ref 13.0–17.7)
Immature Grans (Abs): 0 10*3/uL (ref 0.0–0.1)
Immature Granulocytes: 0 %
Lymphocytes Absolute: 2.8 10*3/uL (ref 0.7–3.1)
Lymphs: 38 %
MCH: 28.4 pg (ref 26.6–33.0)
MCHC: 33.7 g/dL (ref 31.5–35.7)
MCV: 84 fL (ref 79–97)
Monocytes Absolute: 0.7 10*3/uL (ref 0.1–0.9)
Monocytes: 9 %
Neutrophils Absolute: 3.6 10*3/uL (ref 1.4–7.0)
Neutrophils: 48 %
Platelets: 398 10*3/uL (ref 150–450)
RBC: 6.52 x10E6/uL — ABNORMAL HIGH (ref 4.14–5.80)
RDW: 13.8 % (ref 11.6–15.4)
WBC: 7.5 10*3/uL (ref 3.4–10.8)

## 2020-11-23 ENCOUNTER — Telehealth (INDEPENDENT_AMBULATORY_CARE_PROVIDER_SITE_OTHER): Payer: Self-pay | Admitting: Licensed Clinical Social Worker

## 2020-11-23 NOTE — Telephone Encounter (Signed)
Call placed to patient regarding [facility] referral. LCSW left message requesting a return call.  

## 2020-11-26 ENCOUNTER — Telehealth (INDEPENDENT_AMBULATORY_CARE_PROVIDER_SITE_OTHER): Payer: Self-pay

## 2020-11-26 NOTE — Telephone Encounter (Signed)
-----   Message from Grayce Sessions, NP sent at 11/23/2020 12:19 PM EST ----- Unclear if a smoker or lived prior in [place] hemoglobin and hematocrit elevated no problems or intervention needed . This is also a hx og

## 2020-11-26 NOTE — Telephone Encounter (Signed)
Patient is aware of results per PCP. Teletha Petrea S Ohm Dentler, CMA  

## 2020-12-06 ENCOUNTER — Telehealth: Payer: Self-pay | Admitting: Licensed Clinical Social Worker

## 2020-12-06 NOTE — Telephone Encounter (Signed)
Call placed to patient regarding [facility] referral. LCSW left message requesting a return call.  

## 2021-05-19 ENCOUNTER — Other Ambulatory Visit: Payer: Self-pay

## 2021-05-19 ENCOUNTER — Ambulatory Visit (INDEPENDENT_AMBULATORY_CARE_PROVIDER_SITE_OTHER): Payer: Self-pay | Admitting: Nurse Practitioner

## 2021-05-19 ENCOUNTER — Encounter (INDEPENDENT_AMBULATORY_CARE_PROVIDER_SITE_OTHER): Payer: Self-pay | Admitting: Nurse Practitioner

## 2021-05-19 VITALS — BP 130/89 | HR 50 | Temp 97.2°F | Ht 78.0 in | Wt 320.6 lb

## 2021-05-19 DIAGNOSIS — G5700 Lesion of sciatic nerve, unspecified lower limb: Secondary | ICD-10-CM

## 2021-05-19 DIAGNOSIS — I10 Essential (primary) hypertension: Secondary | ICD-10-CM

## 2021-05-19 MED ORDER — PREDNISONE 20 MG PO TABS
20.0000 mg | ORAL_TABLET | Freq: Every day | ORAL | 0 refills | Status: AC
Start: 2021-05-19 — End: 2021-05-24

## 2021-05-19 NOTE — Progress Notes (Signed)
Subjective:    Patient here for follow-up of elevated blood pressure.  He is exercising and is adherent to a low-salt diet.  Blood pressure is well controlled at home. Cardiac symptoms: none. Patient denies: chest pain, irregular heart beat, and palpitations. Cardiovascular risk factors: hypertension, male gender, and obesity (BMI >= 30 kg/m2). Use of agents associated with hypertension: none. History of target organ damage: none.  Patient complains of sciatic nerve pain: chronic - trying stretches at home.     Review of Systems Review of Systems  Constitutional: Negative.   HENT: Negative.    Eyes: Negative.   Respiratory: Negative.    Cardiovascular: Negative.   Gastrointestinal: Negative.   Genitourinary: Negative.   Musculoskeletal:  Positive for back pain (with sciatica).  Skin: Negative.   Neurological: Negative.   Endo/Heme/Allergies: Negative.   Psychiatric/Behavioral: Negative.        Objective:    Physical Exam Constitutional:      General: He is not in acute distress. Cardiovascular:     Rate and Rhythm: Normal rate and regular rhythm.  Pulmonary:     Effort: Pulmonary effort is normal.     Breath sounds: Normal breath sounds.  Musculoskeletal:       Back:     Comments: Low back pain radiating into lower extremities  Skin:    General: Skin is warm and dry.  Neurological:     Mental Status: He is alert and oriented to person, place, and time.  Psychiatric:        Mood and Affect: Affect normal.      Assessment:    Hypertension  Sciatica    Plan:    Dietary sodium restriction. Regular aerobic exercise. Check blood pressures weekly and record. Follow up: 3 months and as needed.   Patient Instructions  Essential hypertension:  Blood pressure is at goal of < than 130/80, low-sodium, DASH diet, medication compliance, 150 minutes of moderate intensity exercise per week. Discussed medication compliance, adverse effects.Continue medication no changes   -     metoprolol tartrate (LOPRESSOR) 50 MG tablet; Take 0.5 tablets (25 mg total) by mouth 2 (two) times daily. -     amLODipine (NORVASC) 10 MG tablet; Take 1 tablet (10 mg total) by mouth daily. -     lisinopril-hydrochlorothiazide (ZESTORETIC) 20-25 MG tablet; Take 1 tablet by mouth daily. -     CBC with Differential  Low sodium diet  Exercise as tolerated  Sciatic nerve pain:  Will order prednisone  Continue stretching   Follow up:  Follow up in 3 months with Gwinda Passe  Sciatica  Sciatica is pain, weakness, tingling, or loss of feeling (numbness) along the sciatic nerve. The sciatic nerve starts in the lower back and goes down the back of each leg. Sciatica usually goes away on its own or with treatment. Sometimes, sciatica may come back (recur). What are the causes? This condition happens when the sciatic nerve is pinched or has pressure put on it. This may be the result of: A disk in between the bones of the spine bulging out too far (herniated disk). Changes in the spinal disks that occur with aging. A condition that affects a muscle in the butt. Extra bone growth near the sciatic nerve. A break (fracture) of the area between your hip bones (pelvis). Pregnancy. Tumor. This is rare. What increases the risk? You are more likely to develop this condition if you: Play sports that put pressure or stress on the spine. Have poor  strength and ease of movement (flexibility). Have had a back injury in the past. Have had back surgery. Sit for long periods of time. Do activities that involve bending or lifting over and over again. Are very overweight (obese). What are the signs or symptoms? Symptoms can vary from mild to very bad. They may include: Any of these problems in the lower back, leg, hip, or butt: Mild tingling, loss of feeling, or dull aches. Burning sensations. Sharp pains. Loss of feeling in the back of the calf or the sole of the foot. Leg  weakness. Very bad back pain that makes it hard to move. These symptoms may get worse when you cough, sneeze, or laugh. They may alsoget worse when you sit or stand for long periods of time. How is this treated? This condition often gets better without any treatment. However, treatment may include: Changing or cutting back on physical activity when you have pain. Doing exercises and stretching. Putting ice or heat on the affected area. Medicines that help: To relieve pain and swelling. To relax your muscles. Shots (injections) of medicines that help to relieve pain, irritation, and swelling. Surgery. Follow these instructions at home: Medicines Take over-the-counter and prescription medicines only as told by your doctor. Ask your doctor if the medicine prescribed to you: Requires you to avoid driving or using heavy machinery. Can cause trouble pooping (constipation). You may need to take these steps to prevent or treat trouble pooping: Drink enough fluids to keep your pee (urine) pale yellow. Take over-the-counter or prescription medicines. Eat foods that are high in fiber. These include beans, whole grains, and fresh fruits and vegetables. Limit foods that are high in fat and sugar. These include fried or sweet foods. Managing pain     If told, put ice on the affected area. Put ice in a plastic bag. Place a towel between your skin and the bag. Leave the ice on for 20 minutes, 2-3 times a day. If told, put heat on the affected area. Use the heat source that your doctor tells you to use, such as a moist heat pack or a heating pad. Place a towel between your skin and the heat source. Leave the heat on for 20-30 minutes. Remove the heat if your skin turns bright red. This is very important if you are unable to feel pain, heat, or cold. You may have a greater risk of getting burned. Activity  Return to your normal activities as told by your doctor. Ask your doctor what activities are  safe for you. Avoid activities that make your symptoms worse. Take short rests during the day. When you rest for a long time, do some physical activity or stretching between periods of rest. Avoid sitting for a long time without moving. Get up and move around at least one time each hour. Exercise and stretch regularly, as told by your doctor. Do not lift anything that is heavier than 10 lb (4.5 kg) while you have symptoms of sciatica. Avoid lifting heavy things even when you do not have symptoms. Avoid lifting heavy things over and over. When you lift objects, always lift in a way that is safe for your body. To do this, you should: Bend your knees. Keep the object close to your body. Avoid twisting.  General instructions Stay at a healthy weight. Wear comfortable shoes that support your feet. Avoid wearing high heels. Avoid sleeping on a mattress that is too soft or too hard. You might have less pain if  you sleep on a mattress that is firm enough to support your back. Keep all follow-up visits as told by your doctor. This is important. Contact a doctor if: You have pain that: Wakes you up when you are sleeping. Gets worse when you lie down. Is worse than the pain you have had in the past. Lasts longer than 4 weeks. You lose weight without trying. Get help right away if: You cannot control when you pee (urinate) or poop (have a bowel movement). You have weakness in any of these areas and it gets worse: Lower back. The area between your hip bones. Butt. Legs. You have redness or swelling of your back. You have a burning feeling when you pee. Summary Sciatica is pain, weakness, tingling, or loss of feeling (numbness) along the sciatic nerve. This condition happens when the sciatic nerve is pinched or has pressure put on it. Sciatica can cause pain, tingling, or loss of feeling (numbness) in the lower back, legs, hips, and butt. Treatment often includes rest, exercise, medicines,  and putting ice or heat on the affected area. This information is not intended to replace advice given to you by your health care provider. Make sure you discuss any questions you have with your healthcare provider. Document Revised: 10/07/2018 Document Reviewed: 10/07/2018 Elsevier Patient Education  2022 [employer]   Hypertension, Adult High blood pressure (hypertension) is when the force of blood pumping through the arteries is too strong. The arteries are the blood vessels that carry blood from the heart throughout the body. Hypertension forces the heart to work harder to pump blood and may cause arteries to become narrow or stiff. Untreated or uncontrolled hypertension can cause a heart attack, heart failure, a stroke, kidney disease, and otherproblems. A blood pressure reading consists of a higher number over a lower number. Ideally, your blood pressure should be below 120/80. The first ("top") number is called the systolic pressure. It is a measure of the pressure in your arteries as your heart beats. The second ("bottom") number is called the diastolic pressure. It is a measure of the pressure in your arteries as theheart relaxes. What are the causes? The exact cause of this condition is not known. There are some conditions thatresult in or are related to high blood pressure. What increases the risk? Some risk factors for high blood pressure are under your control. The following factors may make you more likely to develop this condition: Smoking. Having type 2 diabetes mellitus, high cholesterol, or both. Not getting enough exercise or physical activity. Being overweight. Having too much fat, sugar, calories, or salt (sodium) in your diet. Drinking too much alcohol. Some risk factors for high blood pressure may be difficult or impossible to change. Some of these factors include: Having chronic kidney disease. Having a family history of high blood pressure. Age. Risk increases with  age. Race. You may be at higher risk if you are African American. Gender. Men are at higher risk than women before age 1. After age 57, women are at higher risk than men. Having obstructive sleep apnea. Stress. What are the signs or symptoms? High blood pressure may not cause symptoms. Very high blood pressure (hypertensive crisis) may cause: Headache. Anxiety. Shortness of breath. Nosebleed. Nausea and vomiting. Vision changes. Severe chest pain. Seizures. How is this diagnosed? This condition is diagnosed by measuring your blood pressure while you are seated, with your arm resting on a flat surface, your legs uncrossed, and your feet flat on the floor.  The cuff of the blood pressure monitor will be placed directly against the skin of your upper arm at the level of your heart. It should be measured at least twice using the same arm. Certain conditions cancause a difference in blood pressure between your right and left arms. Certain factors can cause blood pressure readings to be lower or higher than normal for a short period of time: When your blood pressure is higher when you are in a health care provider's office than when you are at home, this is called white coat hypertension. Most people with this condition do not need medicines. When your blood pressure is higher at home than when you are in a health care provider's office, this is called masked hypertension. Most people with this condition may need medicines to control blood pressure. If you have a high blood pressure reading during one visit or you have normal blood pressure with other risk factors, you may be asked to: Return on a different day to have your blood pressure checked again. Monitor your blood pressure at home for 1 week or longer. If you are diagnosed with hypertension, you may have other blood or imaging tests to help your health care provider understand your overall risk for otherconditions. How is this  treated? This condition is treated by making healthy lifestyle changes, such as eating healthy foods, exercising more, and reducing your alcohol intake. Your health care provider may prescribe medicine if lifestyle changes are not enough to get your blood pressure under control, and if: Your systolic blood pressure is above 130. Your diastolic blood pressure is above 80. Your personal target blood pressure may vary depending on your medicalconditions, your age, and other factors. Follow these instructions at home: Eating and drinking  Eat a diet that is high in fiber and potassium, and low in sodium, added sugar, and fat. An example eating plan is called the DASH (Dietary Approaches to Stop Hypertension) diet. To eat this way: Eat plenty of fresh fruits and vegetables. Try to fill one half of your plate at each meal with fruits and vegetables. Eat whole grains, such as whole-wheat pasta, brown rice, or whole-grain bread. Fill about one fourth of your plate with whole grains. Eat or drink low-fat dairy products, such as skim milk or low-fat yogurt. Avoid fatty cuts of meat, processed or cured meats, and poultry with skin. Fill about one fourth of your plate with lean proteins, such as fish, chicken without skin, beans, eggs, or tofu. Avoid pre-made and processed foods. These tend to be higher in sodium, added sugar, and fat. Reduce your daily sodium intake. Most people with hypertension should eat less than 1,500 mg of sodium a day. Do not drink alcohol if: Your health care provider tells you not to drink. You are pregnant, may be pregnant, or are planning to become pregnant. If you drink alcohol: Limit how much you use to: 0-1 drink a day for women. 0-2 drinks a day for men. Be aware of how much alcohol is in your drink. In the [place] one drink equals one 12 oz bottle of beer (355 mL), one 5 oz glass of wine (148 mL), or one 1 oz glass of hard liquor (44 mL).  Lifestyle  Work with your  health care provider to maintain a healthy body weight or to lose weight. Ask what an ideal weight is for you. Get at least 30 minutes of exercise most days of the week. Activities may include walking, swimming, or biking.  Include exercise to strengthen your muscles (resistance exercise), such as Pilates or lifting weights, as part of your weekly exercise routine. Try to do these types of exercises for 30 minutes at least 3 days a week. Do not use any products that contain nicotine or tobacco, such as cigarettes, e-cigarettes, and chewing tobacco. If you need help quitting, ask your health care provider. Monitor your blood pressure at home as told by your health care provider. Keep all follow-up visits as told by your health care provider. This is important.  Medicines Take over-the-counter and prescription medicines only as told by your health care provider. Follow directions carefully. Blood pressure medicines must be taken as prescribed. Do not skip doses of blood pressure medicine. Doing this puts you at risk for problems and can make the medicine less effective. Ask your health care provider about side effects or reactions to medicines that you should watch for. Contact a health care provider if you: Think you are having a reaction to a medicine you are taking. Have headaches that keep coming back (recurring). Feel dizzy. Have swelling in your ankles. Have trouble with your vision. Get help right away if you: Develop a severe headache or confusion. Have unusual weakness or numbness. Feel faint. Have severe pain in your chest or abdomen. Vomit repeatedly. Have trouble breathing. Summary Hypertension is when the force of blood pumping through your arteries is too strong. If this condition is not controlled, it may put you at risk for serious complications. Your personal target blood pressure may vary depending on your medical conditions, your age, and other factors. For most people, a  normal blood pressure is less than 120/80. Hypertension is treated with lifestyle changes, medicines, or a combination of both. Lifestyle changes include losing weight, eating a healthy, low-sodium diet, exercising more, and limiting alcohol. This information is not intended to replace advice given to you by your health care provider. Make sure you discuss any questions you have with your healthcare provider. Document Revised: 05/29/2018 Document Reviewed: 05/29/2018 Elsevier Patient Education  2022 [employer] Inc.  Managing Your Hypertension Hypertension, also called high blood pressure, is when the force of the blood pressing against the walls of the arteries is too strong. Arteries are blood vessels that carry blood from your heart throughout your body. Hypertension forces the heart to work harder to pump blood and may cause the arteries tobecome narrow or stiff. Understanding blood pressure readings Your personal target blood pressure may vary depending on your medical conditions, your age, and other factors. A blood pressure reading includes a higher number over a lower number. Ideally, your blood pressure should be below 120/80. You should know that: The first, or top, number is called the systolic pressure. It is a measure of the pressure in your arteries as your heart beats. The second, or bottom number, is called the diastolic pressure. It is a measure of the pressure in your arteries as the heart relaxes. Blood pressure is classified into four stages. Based on your blood pressure reading, your health care provider may use the following stages to determine what type of treatment you need, if any. Systolic pressure and diastolicpressure are measured in a unit called mmHg. Normal Systolic pressure: below 120. Diastolic pressure: below 80. Elevated Systolic pressure: 120-129. Diastolic pressure: below 80. Hypertension stage 1 Systolic pressure: 130-139. Diastolic pressure:  80-89. Hypertension stage 2 Systolic pressure: 140 or above. Diastolic pressure: 90 or above. How can this condition affect me? Managing your hypertension is an  important responsibility. Over time, hypertension can damage the arteries and decrease blood flow to important parts of the body, including the brain, heart, and kidneys. Having untreated or uncontrolled hypertension can lead to: A heart attack. A stroke. A weakened blood vessel (aneurysm). Heart failure. Kidney damage. Eye damage. Metabolic syndrome. Memory and concentration problems. Vascular dementia. What actions can I take to manage this condition? Hypertension can be managed by making lifestyle changes and possibly by taking medicines. Your health care provider will help you make a plan to bring yourblood pressure within a normal range. Nutrition  Eat a diet that is high in fiber and potassium, and low in salt (sodium), added sugar, and fat. An example eating plan is called the Dietary Approaches to Stop Hypertension (DASH) diet. To eat this way: Eat plenty of fresh fruits and vegetables. Try to fill one-half of your plate at each meal with fruits and vegetables. Eat whole grains, such as whole-wheat pasta, brown rice, or whole-grain bread. Fill about one-fourth of your plate with whole grains. Eat low-fat dairy products. Avoid fatty cuts of meat, processed or cured meats, and poultry with skin. Fill about one-fourth of your plate with lean proteins such as fish, chicken without skin, beans, eggs, and tofu. Avoid pre-made and processed foods. These tend to be higher in sodium, added sugar, and fat. Reduce your daily sodium intake. Most people with hypertension should eat less than 1,500 mg of sodium a day.  Lifestyle  Work with your health care provider to maintain a healthy body weight or to lose weight. Ask what an ideal weight is for you. Get at least 30 minutes of exercise that causes your heart to beat faster (aerobic  exercise) most days of the week. Activities may include walking, swimming, or biking. Include exercise to strengthen your muscles (resistance exercise), such as weight lifting, as part of your weekly exercise routine. Try to do these types of exercises for 30 minutes at least 3 days a week. Do not use any products that contain nicotine or tobacco, such as cigarettes, e-cigarettes, and chewing tobacco. If you need help quitting, ask your health care provider. Control any long-term (chronic) conditions you have, such as high cholesterol or diabetes. Identify your sources of stress and find ways to manage stress. This may include meditation, deep breathing, or making time for fun activities.  Alcohol use Do not drink alcohol if: Your health care provider tells you not to drink. You are pregnant, may be pregnant, or are planning to become pregnant. If you drink alcohol: Limit how much you use to: 0-1 drink a day for women. 0-2 drinks a day for men. Be aware of how much alcohol is in your drink. In the [place] one drink equals one 12 oz bottle of beer (355 mL), one 5 oz glass of wine (148 mL), or one 1 oz glass of hard liquor (44 mL). Medicines Your health care provider may prescribe medicine if lifestyle changes are not enough to get your blood pressure under control and if: Your systolic blood pressure is 130 or higher. Your diastolic blood pressure is 80 or higher. Take medicines only as told by your health care provider. Follow the directions carefully. Blood pressure medicines must be taken as told by your health care provider. The medicine does not work as well when you skip doses. Skippingdoses also puts you at risk for problems. Monitoring Before you monitor your blood pressure: Do not smoke, drink caffeinated beverages, or exercise within 30 minutes before  taking a measurement. Use the bathroom and empty your bladder (urinate). Sit quietly for at least 5 minutes before taking  measurements. Monitor your blood pressure at home as told by your health care provider. To do this: Sit with your back straight and supported. Place your feet flat on the floor. Do not cross your legs. Support your arm on a flat surface, such as a table. Make sure your upper arm is at heart level. Each time you measure, take two or three readings one minute apart and record the results. You may also need to have your blood pressure checked regularly by your healthcare provider. General information Talk with your health care provider about your diet, exercise habits, and other lifestyle factors that may be contributing to hypertension. Review all the medicines you take with your health care provider because there may be side effects or interactions. Keep all visits as told by your health care provider. Your health care provider can help you create and adjust your plan for managing your high blood pressure. Where to find more information National Heart, Lung, and Blood Institute: [employer]is American Heart Association: www.heart.org Contact a health care provider if: You think you are having a reaction to medicines you have taken. You have repeated (recurrent) headaches. You feel dizzy. You have swelling in your ankles. You have trouble with your vision. Get help right away if: You develop a severe headache or confusion. You have unusual weakness or numbness, or you feel faint. You have severe pain in your chest or abdomen. You vomit repeatedly. You have trouble breathing. These symptoms may represent a serious problem that is an emergency. Do not wait to see if the symptoms will go away. Get medical help right away. Call your local emergency services (911 in the [place] Do not drive yourself to the hospital. Summary Hypertension is when the force of blood pumping through your arteries is too strong. If this condition is not controlled, it may put you at risk for serious  complications. Your personal target blood pressure may vary depending on your medical conditions, your age, and other factors. For most people, a normal blood pressure is less than 120/80. Hypertension is managed by lifestyle changes, medicines, or both. Lifestyle changes to help manage hypertension include losing weight, eating a healthy, low-sodium diet, exercising more, stopping smoking, and limiting alcohol. This information is not intended to replace advice given to you by your health care provider. Make sure you discuss any questions you have with your healthcare provider. Document Revised: 10/24/2019 Document Reviewed: 08/19/2019 Elsevier Patient Education  2022 [employer].  [email or web address].[email or web address]">  DASH Eating Plan DASH stands for Dietary Approaches to Stop Hypertension. The DASH eating plan is a healthy eating plan that has been shown to: Reduce high blood pressure (hypertension). Reduce your risk for type 2 diabetes, heart disease, and stroke. Help with weight loss. What are tips for following this plan? Reading food labels Check food labels for the amount of salt (sodium) per serving. Choose foods with less than 5 percent of the Daily Value of sodium. Generally, foods with less than 300 milligrams (mg) of sodium per serving fit into this eating plan. To find whole grains, look for the word "whole" as the first word in the ingredient list. Shopping Buy products labeled as "low-sodium" or "no salt added." Buy fresh foods. Avoid canned foods and pre-made or frozen meals. Cooking Avoid adding salt when cooking. Use salt-free seasonings or herbs instead of table salt or sea salt. Check with  your health care provider or pharmacist before using salt substitutes. Do not fry foods. Cook foods using healthy methods such as baking, boiling, grilling, roasting, and broiling instead. Cook with heart-healthy oils, such as olive, canola, avocado,  soybean, or sunflower oil. Meal planning  Eat a balanced diet that includes: 4 or more servings of fruits and 4 or more servings of vegetables each day. Try to fill one-half of your plate with fruits and vegetables. 6-8 servings of whole grains each day. Less than 6 oz (170 g) of lean meat, poultry, or fish each day. A 3-oz (85-g) serving of meat is about the same size as a deck of cards. One egg equals 1 oz (28 g). 2-3 servings of low-fat dairy each day. One serving is 1 cup (237 mL). 1 serving of nuts, seeds, or beans 5 times each week. 2-3 servings of heart-healthy fats. Healthy fats called omega-3 fatty acids are found in foods such as walnuts, flaxseeds, fortified milks, and eggs. These fats are also found in cold-water fish, such as sardines, salmon, and mackerel. Limit how much you eat of: Canned or prepackaged foods. Food that is high in trans fat, such as some fried foods. Food that is high in saturated fat, such as fatty meat. Desserts and other sweets, sugary drinks, and other foods with added sugar. Full-fat dairy products. Do not salt foods before eating. Do not eat more than 4 egg yolks a week. Try to eat at least 2 vegetarian meals a week. Eat more home-cooked food and less restaurant, buffet, and fast food.  Lifestyle When eating at a restaurant, ask that your food be prepared with less salt or no salt, if possible. If you drink alcohol: Limit how much you use to: 0-1 drink a day for women who are not pregnant. 0-2 drinks a day for men. Be aware of how much alcohol is in your drink. In the [place] one drink equals one 12 oz bottle of beer (355 mL), one 5 oz glass of wine (148 mL), or one 1 oz glass of hard liquor (44 mL). General information Avoid eating more than 2,300 mg of salt a day. If you have hypertension, you may need to reduce your sodium intake to 1,500 mg a day. Work with your health care provider to maintain a healthy body weight or to lose weight. Ask what an  ideal weight is for you. Get at least 30 minutes of exercise that causes your heart to beat faster (aerobic exercise) most days of the week. Activities may include walking, swimming, or biking. Work with your health care provider or dietitian to adjust your eating plan to your individual calorie needs. What foods should I eat? Fruits All fresh, dried, or frozen fruit. Canned fruit in natural juice (without addedsugar). Vegetables Fresh or frozen vegetables (raw, steamed, roasted, or grilled). Low-sodium or reduced-sodium tomato and vegetable juice. Low-sodium or reduced-sodium tomatosauce and tomato paste. Low-sodium or reduced-sodium canned vegetables. Grains Whole-grain or whole-wheat bread. Whole-grain or whole-wheat pasta. Brown rice. Orpah Cobb. Bulgur. Whole-grain and low-sodium cereals. Pita bread.Low-fat, low-sodium crackers. Whole-wheat flour tortillas. Meats and other proteins Skinless chicken or [place]. Ground chicken or [place]. Pork with fat trimmed off. Fish and seafood. Egg whites. Dried beans, peas, or lentils. Unsalted nuts, nut butters, and seeds. Unsalted canned beans. Lean cuts of beef with fat trimmed off. Low-sodium, lean precooked or cured meat, such as sausages or meatloaves. Dairy Low-fat (1%) or fat-free (skim) milk. Reduced-fat, low-fat, or fat-free cheeses. Nonfat, low-sodium ricotta  or cottage cheese. Low-fat or nonfatyogurt. Low-fat, low-sodium cheese. Fats and oils Soft margarine without trans fats. Vegetable oil. Reduced-fat, low-fat, or light mayonnaise and salad dressings (reduced-sodium). Canola, safflower, olive, avocado, soybean, andsunflower oils. Avocado. Seasonings and condiments Herbs. Spices. Seasoning mixes without salt. Other foods Unsalted popcorn and pretzels. Fat-free sweets. The items listed above may not be a complete list of foods and beverages you can eat. Contact a dietitian for more information. What foods should I avoid? Fruits Canned  fruit in a light or heavy syrup. Fried fruit. Fruit in cream or buttersauce. Vegetables Creamed or fried vegetables. Vegetables in a cheese sauce. Regular canned vegetables (not low-sodium or reduced-sodium). Regular canned tomato sauce and paste (not low-sodium or reduced-sodium). Regular tomato and vegetable juice(not low-sodium or reduced-sodium). Rosita Fire. Olives. Grains Baked goods made with fat, such as croissants, muffins, or some breads. Drypasta or rice meal packs. Meats and other proteins Fatty cuts of meat. Ribs. Fried meat. Tomasa Blase. Bologna, salami, and other precooked or cured meats, such as sausages or meat loaves. Fat from the back of a pig (fatback). Bratwurst. Salted nuts and seeds. Canned beans with added salt. Canned orsmoked fish. Whole eggs or egg yolks. Chicken or [place] with skin. Dairy Whole or 2% milk, cream, and half-and-half. Whole or full-fat cream cheese. Whole-fat or sweetened yogurt. Full-fat cheese. Nondairy creamers. Whippedtoppings. Processed cheese and cheese spreads. Fats and oils Butter. Stick margarine. Lard. Shortening. Ghee. Bacon fat. Tropical oils, suchas coconut, palm kernel, or palm oil. Seasonings and condiments Onion salt, garlic salt, seasoned salt, table salt, and sea salt. Worcestershire sauce. Tartar sauce. Barbecue sauce. Teriyaki sauce. Soy sauce, including reduced-sodium. Steak sauce. Canned and packaged gravies. Fish sauce. Oyster sauce. Cocktail sauce. Store-bought horseradish. Ketchup. Mustard. Meat flavorings and tenderizers. Bouillon cubes. Hot sauces. Pre-made or packaged marinades. Pre-made or packaged taco seasonings. Relishes. Regular saladdressings. Other foods Salted popcorn and pretzels. The items listed above may not be a complete list of foods and beverages you should avoid. Contact a dietitian for more information. Where to find more information National Heart, Lung, and Blood Institute: PopSteam.is American Heart Association:  www.heart.org Academy of Nutrition and Dietetics: www.eatright.org National Kidney Foundation: www.kidney.org Summary The DASH eating plan is a healthy eating plan that has been shown to reduce high blood pressure (hypertension). It may also reduce your risk for type 2 diabetes, heart disease, and stroke. When on the DASH eating plan, aim to eat more fresh fruits and vegetables, whole grains, lean proteins, low-fat dairy, and heart-healthy fats. With the DASH eating plan, you should limit salt (sodium) intake to 2,300 mg a day. If you have hypertension, you may need to reduce your sodium intake to 1,500 mg a day. Work with your health care provider or dietitian to adjust your eating plan to your individual calorie needs. This information is not intended to replace advice given to you by your health care provider. Make sure you discuss any questions you have with your healthcare provider. Document Revised: 08/22/2019 Document Reviewed: 08/22/2019 Elsevier Patient Education  2022 [employer].   [place]. Tanda Rockers, FNP-C  05/19/21

## 2021-05-19 NOTE — Patient Instructions (Addendum)
Essential hypertension:  Blood pressure is at goal of < than 130/80, low-sodium, DASH diet, medication compliance, 150 minutes of moderate intensity exercise per week. Discussed medication compliance, adverse effects.Continue medication no changes  -     metoprolol tartrate (LOPRESSOR) 50 MG tablet; Take 0.5 tablets (25 mg total) by mouth 2 (two) times daily. -     amLODipine (NORVASC) 10 MG tablet; Take 1 tablet (10 mg total) by mouth daily. -     lisinopril-hydrochlorothiazide (ZESTORETIC) 20-25 MG tablet; Take 1 tablet by mouth daily. -     CBC with Differential  Low sodium diet  Exercise as tolerated  Sciatic nerve pain:  Will order prednisone  Continue stretching   Follow up:  Follow up in 3 months with Alfred Avila  Sciatica  Sciatica is pain, weakness, tingling, or loss of feeling (numbness) along the sciatic nerve. The sciatic nerve starts in the lower back and goes down the back of each leg. Sciatica usually goes away on its own or with treatment. Sometimes, sciatica may come back (recur). What are the causes? This condition happens when the sciatic nerve is pinched or has pressure put on it. This may be the result of: A disk in between the bones of the spine bulging out too far (herniated disk). Changes in the spinal disks that occur with aging. A condition that affects a muscle in the butt. Extra bone growth near the sciatic nerve. A break (fracture) of the area between your hip bones (pelvis). Pregnancy. Tumor. This is rare. What increases the risk? You are more likely to develop this condition if you: Play sports that put pressure or stress on the spine. Have poor strength and ease of movement (flexibility). Have had a back injury in the past. Have had back surgery. Sit for long periods of time. Do activities that involve bending or lifting over and over again. Are very overweight (obese). What are the signs or symptoms? Symptoms can vary from mild to  very bad. They may include: Any of these problems in the lower back, leg, hip, or butt: Mild tingling, loss of feeling, or dull aches. Burning sensations. Sharp pains. Loss of feeling in the back of the calf or the sole of the foot. Leg weakness. Very bad back pain that makes it hard to move. These symptoms may get worse when you cough, sneeze, or laugh. They may alsoget worse when you sit or stand for long periods of time. How is this treated? This condition often gets better without any treatment. However, treatment may include: Changing or cutting back on physical activity when you have pain. Doing exercises and stretching. Putting ice or heat on the affected area. Medicines that help: To relieve pain and swelling. To relax your muscles. Shots (injections) of medicines that help to relieve pain, irritation, and swelling. Surgery. Follow these instructions at home: Medicines Take over-the-counter and prescription medicines only as told by your doctor. Ask your doctor if the medicine prescribed to you: Requires you to avoid driving or using heavy machinery. Can cause trouble pooping (constipation). You may need to take these steps to prevent or treat trouble pooping: Drink enough fluids to keep your pee (urine) pale yellow. Take over-the-counter or prescription medicines. Eat foods that are high in fiber. These include beans, whole grains, and fresh fruits and vegetables. Limit foods that are high in fat and sugar. These include fried or sweet foods. Managing pain     If told, put ice on the affected area. Put  ice in a plastic bag. Place a towel between your skin and the bag. Leave the ice on for 20 minutes, 2-3 times a day. If told, put heat on the affected area. Use the heat source that your doctor tells you to use, such as a moist heat pack or a heating pad. Place a towel between your skin and the heat source. Leave the heat on for 20-30 minutes. Remove the heat if your  skin turns bright red. This is very important if you are unable to feel pain, heat, or cold. You may have a greater risk of getting burned. Activity  Return to your normal activities as told by your doctor. Ask your doctor what activities are safe for you. Avoid activities that make your symptoms worse. Take short rests during the day. When you rest for a long time, do some physical activity or stretching between periods of rest. Avoid sitting for a long time without moving. Get up and move around at least one time each hour. Exercise and stretch regularly, as told by your doctor. Do not lift anything that is heavier than 10 lb (4.5 kg) while you have symptoms of sciatica. Avoid lifting heavy things even when you do not have symptoms. Avoid lifting heavy things over and over. When you lift objects, always lift in a way that is safe for your body. To do this, you should: Bend your knees. Keep the object close to your body. Avoid twisting.  General instructions Stay at a healthy weight. Wear comfortable shoes that support your feet. Avoid wearing high heels. Avoid sleeping on a mattress that is too soft or too hard. You might have less pain if you sleep on a mattress that is firm enough to support your back. Keep all follow-up visits as told by your doctor. This is important. Contact a doctor if: You have pain that: Wakes you up when you are sleeping. Gets worse when you lie down. Is worse than the pain you have had in the past. Lasts longer than 4 weeks. You lose weight without trying. Get help right away if: You cannot control when you pee (urinate) or poop (have a bowel movement). You have weakness in any of these areas and it gets worse: Lower back. The area between your hip bones. Butt. Legs. You have redness or swelling of your back. You have a burning feeling when you pee. Summary Sciatica is pain, weakness, tingling, or loss of feeling (numbness) along the sciatic  nerve. This condition happens when the sciatic nerve is pinched or has pressure put on it. Sciatica can cause pain, tingling, or loss of feeling (numbness) in the lower back, legs, hips, and butt. Treatment often includes rest, exercise, medicines, and putting ice or heat on the affected area. This information is not intended to replace advice given to you by your health care provider. Make sure you discuss any questions you have with your healthcare provider. Document Revised: 10/07/2018 Document Reviewed: 10/07/2018 Elsevier Patient Education  2022 [employer]   Hypertension, Adult High blood pressure (hypertension) is when the force of blood pumping through the arteries is too strong. The arteries are the blood vessels that carry blood from the heart throughout the body. Hypertension forces the heart to work harder to pump blood and may cause arteries to become narrow or stiff. Untreated or uncontrolled hypertension can cause a heart attack, heart failure, a stroke, kidney disease, and otherproblems. A blood pressure reading consists of a higher number over a lower  number. Ideally, your blood pressure should be below 120/80. The first ("top") number is called the systolic pressure. It is a measure of the pressure in your arteries as your heart beats. The second ("bottom") number is called the diastolic pressure. It is a measure of the pressure in your arteries as theheart relaxes. What are the causes? The exact cause of this condition is not known. There are some conditions thatresult in or are related to high blood pressure. What increases the risk? Some risk factors for high blood pressure are under your control. The following factors may make you more likely to develop this condition: Smoking. Having type 2 diabetes mellitus, high cholesterol, or both. Not getting enough exercise or physical activity. Being overweight. Having too much fat, sugar, calories, or salt (sodium) in your  diet. Drinking too much alcohol. Some risk factors for high blood pressure may be difficult or impossible to change. Some of these factors include: Having chronic kidney disease. Having a family history of high blood pressure. Age. Risk increases with age. Race. You may be at higher risk if you are African American. Gender. Men are at higher risk than women before age 57. After age 45, women are at higher risk than men. Having obstructive sleep apnea. Stress. What are the signs or symptoms? High blood pressure may not cause symptoms. Very high blood pressure (hypertensive crisis) may cause: Headache. Anxiety. Shortness of breath. Nosebleed. Nausea and vomiting. Vision changes. Severe chest pain. Seizures. How is this diagnosed? This condition is diagnosed by measuring your blood pressure while you are seated, with your arm resting on a flat surface, your legs uncrossed, and your feet flat on the floor. The cuff of the blood pressure monitor will be placed directly against the skin of your upper arm at the level of your heart. It should be measured at least twice using the same arm. Certain conditions cancause a difference in blood pressure between your right and left arms. Certain factors can cause blood pressure readings to be lower or higher than normal for a short period of time: When your blood pressure is higher when you are in a health care provider's office than when you are at home, this is called white coat hypertension. Most people with this condition do not need medicines. When your blood pressure is higher at home than when you are in a health care provider's office, this is called masked hypertension. Most people with this condition may need medicines to control blood pressure. If you have a high blood pressure reading during one visit or you have normal blood pressure with other risk factors, you may be asked to: Return on a different day to have your blood pressure checked  again. Monitor your blood pressure at home for 1 week or longer. If you are diagnosed with hypertension, you may have other blood or imaging tests to help your health care provider understand your overall risk for otherconditions. How is this treated? This condition is treated by making healthy lifestyle changes, such as eating healthy foods, exercising more, and reducing your alcohol intake. Your health care provider may prescribe medicine if lifestyle changes are not enough to get your blood pressure under control, and if: Your systolic blood pressure is above 130. Your diastolic blood pressure is above 80. Your personal target blood pressure may vary depending on your medicalconditions, your age, and other factors. Follow these instructions at home: Eating and drinking  Eat a diet that is high in fiber and potassium,  and low in sodium, added sugar, and fat. An example eating plan is called the DASH (Dietary Approaches to Stop Hypertension) diet. To eat this way: Eat plenty of fresh fruits and vegetables. Try to fill one half of your plate at each meal with fruits and vegetables. Eat whole grains, such as whole-wheat pasta, brown rice, or whole-grain bread. Fill about one fourth of your plate with whole grains. Eat or drink low-fat dairy products, such as skim milk or low-fat yogurt. Avoid fatty cuts of meat, processed or cured meats, and poultry with skin. Fill about one fourth of your plate with lean proteins, such as fish, chicken without skin, beans, eggs, or tofu. Avoid pre-made and processed foods. These tend to be higher in sodium, added sugar, and fat. Reduce your daily sodium intake. Most people with hypertension should eat less than 1,500 mg of sodium a day. Do not drink alcohol if: Your health care provider tells you not to drink. You are pregnant, may be pregnant, or are planning to become pregnant. If you drink alcohol: Limit how much you use to: 0-1 drink a day for women. 0-2  drinks a day for men. Be aware of how much alcohol is in your drink. In the [place] one drink equals one 12 oz bottle of beer (355 mL), one 5 oz glass of wine (148 mL), or one 1 oz glass of hard liquor (44 mL).  Lifestyle  Work with your health care provider to maintain a healthy body weight or to lose weight. Ask what an ideal weight is for you. Get at least 30 minutes of exercise most days of the week. Activities may include walking, swimming, or biking. Include exercise to strengthen your muscles (resistance exercise), such as Pilates or lifting weights, as part of your weekly exercise routine. Try to do these types of exercises for 30 minutes at least 3 days a week. Do not use any products that contain nicotine or tobacco, such as cigarettes, e-cigarettes, and chewing tobacco. If you need help quitting, ask your health care provider. Monitor your blood pressure at home as told by your health care provider. Keep all follow-up visits as told by your health care provider. This is important.  Medicines Take over-the-counter and prescription medicines only as told by your health care provider. Follow directions carefully. Blood pressure medicines must be taken as prescribed. Do not skip doses of blood pressure medicine. Doing this puts you at risk for problems and can make the medicine less effective. Ask your health care provider about side effects or reactions to medicines that you should watch for. Contact a health care provider if you: Think you are having a reaction to a medicine you are taking. Have headaches that keep coming back (recurring). Feel dizzy. Have swelling in your ankles. Have trouble with your vision. Get help right away if you: Develop a severe headache or confusion. Have unusual weakness or numbness. Feel faint. Have severe pain in your chest or abdomen. Vomit repeatedly. Have trouble breathing. Summary Hypertension is when the force of blood pumping through your  arteries is too strong. If this condition is not controlled, it may put you at risk for serious complications. Your personal target blood pressure may vary depending on your medical conditions, your age, and other factors. For most people, a normal blood pressure is less than 120/80. Hypertension is treated with lifestyle changes, medicines, or a combination of both. Lifestyle changes include losing weight, eating a healthy, low-sodium diet, exercising more, and limiting  alcohol. This information is not intended to replace advice given to you by your health care provider. Make sure you discuss any questions you have with your healthcare provider. Document Revised: 05/29/2018 Document Reviewed: 05/29/2018 Elsevier Patient Education  2022 [employer]  Managing Your Hypertension Hypertension, also called high blood pressure, is when the force of the blood pressing against the walls of the arteries is too strong. Arteries are blood vessels that carry blood from your heart throughout your body. Hypertension forces the heart to work harder to pump blood and may cause the arteries tobecome narrow or stiff. Understanding blood pressure readings Your personal target blood pressure may vary depending on your medical conditions, your age, and other factors. A blood pressure reading includes a higher number over a lower number. Ideally, your blood pressure should be below 120/80. You should know that: The first, or top, number is called the systolic pressure. It is a measure of the pressure in your arteries as your heart beats. The second, or bottom number, is called the diastolic pressure. It is a measure of the pressure in your arteries as the heart relaxes. Blood pressure is classified into four stages. Based on your blood pressure reading, your health care provider may use the following stages to determine what type of treatment you need, if any. Systolic pressure and diastolicpressure are measured in a unit  called mmHg. Normal Systolic pressure: below 120. Diastolic pressure: below 80. Elevated Systolic pressure: 120-129. Diastolic pressure: below 80. Hypertension stage 1 Systolic pressure: 130-139. Diastolic pressure: 80-89. Hypertension stage 2 Systolic pressure: 140 or above. Diastolic pressure: 90 or above. How can this condition affect me? Managing your hypertension is an important responsibility. Over time, hypertension can damage the arteries and decrease blood flow to important parts of the body, including the brain, heart, and kidneys. Having untreated or uncontrolled hypertension can lead to: A heart attack. A stroke. A weakened blood vessel (aneurysm). Heart failure. Kidney damage. Eye damage. Metabolic syndrome. Memory and concentration problems. Vascular dementia. What actions can I take to manage this condition? Hypertension can be managed by making lifestyle changes and possibly by taking medicines. Your health care provider will help you make a plan to bring yourblood pressure within a normal range. Nutrition  Eat a diet that is high in fiber and potassium, and low in salt (sodium), added sugar, and fat. An example eating plan is called the Dietary Approaches to Stop Hypertension (DASH) diet. To eat this way: Eat plenty of fresh fruits and vegetables. Try to fill one-half of your plate at each meal with fruits and vegetables. Eat whole grains, such as whole-wheat pasta, brown rice, or whole-grain bread. Fill about one-fourth of your plate with whole grains. Eat low-fat dairy products. Avoid fatty cuts of meat, processed or cured meats, and poultry with skin. Fill about one-fourth of your plate with lean proteins such as fish, chicken without skin, beans, eggs, and tofu. Avoid pre-made and processed foods. These tend to be higher in sodium, added sugar, and fat. Reduce your daily sodium intake. Most people with hypertension should eat less than 1,500 mg of sodium a  day.  Lifestyle  Work with your health care provider to maintain a healthy body weight or to lose weight. Ask what an ideal weight is for you. Get at least 30 minutes of exercise that causes your heart to beat faster (aerobic exercise) most days of the week. Activities may include walking, swimming, or biking. Include exercise to strengthen your muscles (resistance  exercise), such as weight lifting, as part of your weekly exercise routine. Try to do these types of exercises for 30 minutes at least 3 days a week. Do not use any products that contain nicotine or tobacco, such as cigarettes, e-cigarettes, and chewing tobacco. If you need help quitting, ask your health care provider. Control any long-term (chronic) conditions you have, such as high cholesterol or diabetes. Identify your sources of stress and find ways to manage stress. This may include meditation, deep breathing, or making time for fun activities.  Alcohol use Do not drink alcohol if: Your health care provider tells you not to drink. You are pregnant, may be pregnant, or are planning to become pregnant. If you drink alcohol: Limit how much you use to: 0-1 drink a day for women. 0-2 drinks a day for men. Be aware of how much alcohol is in your drink. In the [place] one drink equals one 12 oz bottle of beer (355 mL), one 5 oz glass of wine (148 mL), or one 1 oz glass of hard liquor (44 mL). Medicines Your health care provider may prescribe medicine if lifestyle changes are not enough to get your blood pressure under control and if: Your systolic blood pressure is 130 or higher. Your diastolic blood pressure is 80 or higher. Take medicines only as told by your health care provider. Follow the directions carefully. Blood pressure medicines must be taken as told by your health care provider. The medicine does not work as well when you skip doses. Skippingdoses also puts you at risk for problems. Monitoring Before you monitor your  blood pressure: Do not smoke, drink caffeinated beverages, or exercise within 30 minutes before taking a measurement. Use the bathroom and empty your bladder (urinate). Sit quietly for at least 5 minutes before taking measurements. Monitor your blood pressure at home as told by your health care provider. To do this: Sit with your back straight and supported. Place your feet flat on the floor. Do not cross your legs. Support your arm on a flat surface, such as a table. Make sure your upper arm is at heart level. Each time you measure, take two or three readings one minute apart and record the results. You may also need to have your blood pressure checked regularly by your healthcare provider. General information Talk with your health care provider about your diet, exercise habits, and other lifestyle factors that may be contributing to hypertension. Review all the medicines you take with your health care provider because there may be side effects or interactions. Keep all visits as told by your health care provider. Your health care provider can help you create and adjust your plan for managing your high blood pressure. Where to find more information National Heart, Lung, and Blood Institute: [employer]is American Heart Association: www.heart.org Contact a health care provider if: You think you are having a reaction to medicines you have taken. You have repeated (recurrent) headaches. You feel dizzy. You have swelling in your ankles. You have trouble with your vision. Get help right away if: You develop a severe headache or confusion. You have unusual weakness or numbness, or you feel faint. You have severe pain in your chest or abdomen. You vomit repeatedly. You have trouble breathing. These symptoms may represent a serious problem that is an emergency. Do not wait to see if the symptoms will go away. Get medical help right away. Call your local emergency services (911 in the [place]  Do not drive yourself to  the hospital. Summary Hypertension is when the force of blood pumping through your arteries is too strong. If this condition is not controlled, it may put you at risk for serious complications. Your personal target blood pressure may vary depending on your medical conditions, your age, and other factors. For most people, a normal blood pressure is less than 120/80. Hypertension is managed by lifestyle changes, medicines, or both. Lifestyle changes to help manage hypertension include losing weight, eating a healthy, low-sodium diet, exercising more, stopping smoking, and limiting alcohol. This information is not intended to replace advice given to you by your health care provider. Make sure you discuss any questions you have with your healthcare provider. Document Revised: 10/24/2019 Document Reviewed: 08/19/2019 Elsevier Patient Education  2022 [employer].  [email or web address].[email or web address]">  DASH Eating Plan DASH stands for Dietary Approaches to Stop Hypertension. The DASH eating plan is a healthy eating plan that has been shown to: Reduce high blood pressure (hypertension). Reduce your risk for type 2 diabetes, heart disease, and stroke. Help with weight loss. What are tips for following this plan? Reading food labels Check food labels for the amount of salt (sodium) per serving. Choose foods with less than 5 percent of the Daily Value of sodium. Generally, foods with less than 300 milligrams (mg) of sodium per serving fit into this eating plan. To find whole grains, look for the word "whole" as the first word in the ingredient list. Shopping Buy products labeled as "low-sodium" or "no salt added." Buy fresh foods. Avoid canned foods and pre-made or frozen meals. Cooking Avoid adding salt when cooking. Use salt-free seasonings or herbs instead of table salt or sea salt. Check with your health care provider or pharmacist before  using salt substitutes. Do not fry foods. Cook foods using healthy methods such as baking, boiling, grilling, roasting, and broiling instead. Cook with heart-healthy oils, such as olive, canola, avocado, soybean, or sunflower oil. Meal planning  Eat a balanced diet that includes: 4 or more servings of fruits and 4 or more servings of vegetables each day. Try to fill one-half of your plate with fruits and vegetables. 6-8 servings of whole grains each day. Less than 6 oz (170 g) of lean meat, poultry, or fish each day. A 3-oz (85-g) serving of meat is about the same size as a deck of cards. One egg equals 1 oz (28 g). 2-3 servings of low-fat dairy each day. One serving is 1 cup (237 mL). 1 serving of nuts, seeds, or beans 5 times each week. 2-3 servings of heart-healthy fats. Healthy fats called omega-3 fatty acids are found in foods such as walnuts, flaxseeds, fortified milks, and eggs. These fats are also found in cold-water fish, such as sardines, salmon, and mackerel. Limit how much you eat of: Canned or prepackaged foods. Food that is high in trans fat, such as some fried foods. Food that is high in saturated fat, such as fatty meat. Desserts and other sweets, sugary drinks, and other foods with added sugar. Full-fat dairy products. Do not salt foods before eating. Do not eat more than 4 egg yolks a week. Try to eat at least 2 vegetarian meals a week. Eat more home-cooked food and less restaurant, buffet, and fast food.  Lifestyle When eating at a restaurant, ask that your food be prepared with less salt or no salt, if possible. If you drink alcohol: Limit how much you use to: 0-1 drink a day for women who are not pregnant. 0-2 drinks a  day for men. Be aware of how much alcohol is in your drink. In the [place] one drink equals one 12 oz bottle of beer (355 mL), one 5 oz glass of wine (148 mL), or one 1 oz glass of hard liquor (44 mL). General information Avoid eating more than 2,300  mg of salt a day. If you have hypertension, you may need to reduce your sodium intake to 1,500 mg a day. Work with your health care provider to maintain a healthy body weight or to lose weight. Ask what an ideal weight is for you. Get at least 30 minutes of exercise that causes your heart to beat faster (aerobic exercise) most days of the week. Activities may include walking, swimming, or biking. Work with your health care provider or dietitian to adjust your eating plan to your individual calorie needs. What foods should I eat? Fruits All fresh, dried, or frozen fruit. Canned fruit in natural juice (without addedsugar). Vegetables Fresh or frozen vegetables (raw, steamed, roasted, or grilled). Low-sodium or reduced-sodium tomato and vegetable juice. Low-sodium or reduced-sodium tomatosauce and tomato paste. Low-sodium or reduced-sodium canned vegetables. Grains Whole-grain or whole-wheat bread. Whole-grain or whole-wheat pasta. Brown rice. Orpah Cobb. Bulgur. Whole-grain and low-sodium cereals. Pita bread.Low-fat, low-sodium crackers. Whole-wheat flour tortillas. Meats and other proteins Skinless chicken or [place]. Ground chicken or [place]. Pork with fat trimmed off. Fish and seafood. Egg whites. Dried beans, peas, or lentils. Unsalted nuts, nut butters, and seeds. Unsalted canned beans. Lean cuts of beef with fat trimmed off. Low-sodium, lean precooked or cured meat, such as sausages or meatloaves. Dairy Low-fat (1%) or fat-free (skim) milk. Reduced-fat, low-fat, or fat-free cheeses. Nonfat, low-sodium ricotta or cottage cheese. Low-fat or nonfatyogurt. Low-fat, low-sodium cheese. Fats and oils Soft margarine without trans fats. Vegetable oil. Reduced-fat, low-fat, or light mayonnaise and salad dressings (reduced-sodium). Canola, safflower, olive, avocado, soybean, andsunflower oils. Avocado. Seasonings and condiments Herbs. Spices. Seasoning mixes without salt. Other foods Unsalted  popcorn and pretzels. Fat-free sweets. The items listed above may not be a complete list of foods and beverages you can eat. Contact a dietitian for more information. What foods should I avoid? Fruits Canned fruit in a light or heavy syrup. Fried fruit. Fruit in cream or buttersauce. Vegetables Creamed or fried vegetables. Vegetables in a cheese sauce. Regular canned vegetables (not low-sodium or reduced-sodium). Regular canned tomato sauce and paste (not low-sodium or reduced-sodium). Regular tomato and vegetable juice(not low-sodium or reduced-sodium). Rosita Fire. Olives. Grains Baked goods made with fat, such as croissants, muffins, or some breads. Drypasta or rice meal packs. Meats and other proteins Fatty cuts of meat. Ribs. Fried meat. Tomasa Blase. Bologna, salami, and other precooked or cured meats, such as sausages or meat loaves. Fat from the back of a pig (fatback). Bratwurst. Salted nuts and seeds. Canned beans with added salt. Canned orsmoked fish. Whole eggs or egg yolks. Chicken or [place] with skin. Dairy Whole or 2% milk, cream, and half-and-half. Whole or full-fat cream cheese. Whole-fat or sweetened yogurt. Full-fat cheese. Nondairy creamers. Whippedtoppings. Processed cheese and cheese spreads. Fats and oils Butter. Stick margarine. Lard. Shortening. Ghee. Bacon fat. Tropical oils, suchas coconut, palm kernel, or palm oil. Seasonings and condiments Onion salt, garlic salt, seasoned salt, table salt, and sea salt. Worcestershire sauce. Tartar sauce. Barbecue sauce. Teriyaki sauce. Soy sauce, including reduced-sodium. Steak sauce. Canned and packaged gravies. Fish sauce. Oyster sauce. Cocktail sauce. Store-bought horseradish. Ketchup. Mustard. Meat flavorings and tenderizers. Bouillon cubes. Hot sauces. Pre-made or packaged marinades. Pre-made  or packaged taco seasonings. Relishes. Regular saladdressings. Other foods Salted popcorn and pretzels. The items listed above may not be a complete  list of foods and beverages you should avoid. Contact a dietitian for more information. Where to find more information National Heart, Lung, and Blood Institute: PopSteam.is American Heart Association: www.heart.org Academy of Nutrition and Dietetics: www.eatright.org National Kidney Foundation: www.kidney.org Summary The DASH eating plan is a healthy eating plan that has been shown to reduce high blood pressure (hypertension). It may also reduce your risk for type 2 diabetes, heart disease, and stroke. When on the DASH eating plan, aim to eat more fresh fruits and vegetables, whole grains, lean proteins, low-fat dairy, and heart-healthy fats. With the DASH eating plan, you should limit salt (sodium) intake to 2,300 mg a day. If you have hypertension, you may need to reduce your sodium intake to 1,500 mg a day. Work with your health care provider or dietitian to adjust your eating plan to your individual calorie needs. This information is not intended to replace advice given to you by your health care provider. Make sure you discuss any questions you have with your healthcare provider. Document Revised: 08/22/2019 Document Reviewed: 08/22/2019 Elsevier Patient Education  2022 [employer].

## 2021-05-19 NOTE — Progress Notes (Signed)
Sciatic nerve pain

## 2021-07-01 ENCOUNTER — Other Ambulatory Visit (INDEPENDENT_AMBULATORY_CARE_PROVIDER_SITE_OTHER): Payer: Self-pay | Admitting: Primary Care

## 2021-07-01 DIAGNOSIS — Z760 Encounter for issue of repeat prescription: Secondary | ICD-10-CM

## 2021-07-01 DIAGNOSIS — I10 Essential (primary) hypertension: Secondary | ICD-10-CM

## 2021-08-22 ENCOUNTER — Other Ambulatory Visit: Payer: Self-pay

## 2021-08-22 ENCOUNTER — Encounter (INDEPENDENT_AMBULATORY_CARE_PROVIDER_SITE_OTHER): Payer: Self-pay | Admitting: Primary Care

## 2021-08-22 ENCOUNTER — Ambulatory Visit (INDEPENDENT_AMBULATORY_CARE_PROVIDER_SITE_OTHER): Payer: Self-pay | Admitting: Primary Care

## 2021-08-22 VITALS — BP 101/69 | HR 96 | Temp 97.5°F | Ht 78.0 in | Wt 318.4 lb

## 2021-08-22 DIAGNOSIS — I10 Essential (primary) hypertension: Secondary | ICD-10-CM

## 2021-08-22 DIAGNOSIS — M5440 Lumbago with sciatica, unspecified side: Secondary | ICD-10-CM

## 2021-08-22 MED ORDER — PREDNISONE 10 MG PO TABS: 10.0000 mg | ORAL_TABLET | Freq: Every day | ORAL | 0 refills | Status: DC
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
## 2021-08-22 NOTE — Patient Instructions (Signed)
Chronic Back Pain When back pain lasts longer than 3 months, it is called chronic back pain. Pain may get worse at certain times (flare-ups). There are things you can do at home to manage your pain. Follow these instructions at home: Pay attention to any changes in your symptoms. Take these actions to help with your pain: Managing pain and stiffness   If told, put ice on the painful area. Your doctor may tell you to use ice for 24-48 hours after the flare-up starts. To do this: Put ice in a plastic bag. Place a towel between your skin and the bag. Leave the ice on for 20 minutes, 2-3 times a day. If told, put heat on the painful area. Do this as often as told by your doctor. Use the heat source that your doctor recommends, such as a moist heat pack or a heating pad. Place a towel between your skin and the heat source. Leave the heat on for 20-30 minutes. Take off the heat if your skin turns bright red. This is especially important if you are unable to feel pain, heat, or cold. You may have a greater risk of getting burned. Soak in a warm bath. This can help relieve pain. Activity  Avoid bending and other activities that make pain worse. When standing: Keep your upper back and neck straight. Keep your shoulders pulled back. Avoid slouching. When sitting: Keep your back straight. Relax your shoulders. Do not round your shoulders or pull them backward. Do not sit or stand in one place for long periods of time. Take short rest breaks during the day. Lying down or standing is usually better than sitting. Resting can help relieve pain. When sitting or lying down for a long time, do some mild activity or stretching. This will help to prevent stiffness and pain. Get regular exercise. Ask your doctor what activities are safe for you. Do not lift anything that is heavier than 10 lb (4.5 kg) or the limit that you are told, until your doctor says that it is safe. To prevent injury when you lift  things: Bend your knees. Keep the weight close to your body. Avoid twisting. Sleep on a firm mattress. Try lying on your side with your knees slightly bent. If you lie on your back, put a pillow under your knees. Medicines Treatment may include medicines for pain and swelling taken by mouth or put on the skin, prescription pain medicine, or muscle relaxants. Take over-the-counter and prescription medicines only as told by your doctor. Ask your doctor if the medicine prescribed to you: Requires you to avoid driving or using machinery. Can cause trouble pooping (constipation). You may need to take these actions to prevent or treat trouble pooping: Drink enough fluid to keep your pee (urine) pale yellow. Take over-the-counter or prescription medicines. Eat foods that are high in fiber. These include beans, whole grains, and fresh fruits and vegetables. Limit foods that are high in fat and sugars. These include fried or sweet foods. General instructions Do not use any products that contain nicotine or tobacco, such as cigarettes, e-cigarettes, and chewing tobacco. If you need help quitting, ask your doctor. Keep all follow-up visits as told by your doctor. This is important. Contact a doctor if: Your pain does not get better with rest or medicine. Your pain gets worse, or you have new pain. You have a high fever. You lose weight very quickly. You have trouble doing your normal activities. Get help right away if: One   or both of your legs or feet feel weak. One or both of your legs or feet lose feeling (have numbness). You have trouble controlling when you poop (have a bowel movement) or pee (urinate). You have bad back pain and: You feel like you may vomit (nauseous), or you vomit. You have pain in your belly (abdomen). You have shortness of breath. You faint. Summary When back pain lasts longer than 3 months, it is called chronic back pain. Pain may get worse at certain times  (flare-ups). Use ice and heat as told by your doctor. Your doctor may tell you to use ice after flare-ups. This information is not intended to replace advice given to you by your health care provider. Make sure you discuss any questions you have with your health care provider. Document Revised: 10/29/2019 Document Reviewed: 10/29/2019 Elsevier Patient Education  2022 Elsevier Inc.  

## 2021-09-26 ENCOUNTER — Other Ambulatory Visit (INDEPENDENT_AMBULATORY_CARE_PROVIDER_SITE_OTHER): Payer: Self-pay | Admitting: Primary Care

## 2021-09-26 DIAGNOSIS — Z760 Encounter for issue of repeat prescription: Secondary | ICD-10-CM

## 2021-09-26 DIAGNOSIS — I10 Essential (primary) hypertension: Secondary | ICD-10-CM

## 2021-12-26 ENCOUNTER — Telehealth (INDEPENDENT_AMBULATORY_CARE_PROVIDER_SITE_OTHER): Payer: Self-pay | Admitting: Primary Care

## 2021-12-26 ENCOUNTER — Other Ambulatory Visit (INDEPENDENT_AMBULATORY_CARE_PROVIDER_SITE_OTHER): Payer: Self-pay | Admitting: Primary Care

## 2021-12-26 DIAGNOSIS — Z760 Encounter for issue of repeat prescription: Secondary | ICD-10-CM

## 2021-12-26 DIAGNOSIS — I10 Essential (primary) hypertension: Secondary | ICD-10-CM

## 2021-12-26 MED ORDER — METOPROLOL TARTRATE 50 MG PO TABS
25.0000 mg | ORAL_TABLET | Freq: Two times a day (BID) | ORAL | 1 refills | Status: DC
Start: 2021-12-26 — End: 2022-02-20

## 2021-12-26 MED ORDER — LISINOPRIL-HYDROCHLOROTHIAZIDE 20-25 MG PO TABS: 1.0000 | ORAL_TABLET | Freq: Every day | ORAL | 0 refills | Status: DC

## 2021-12-26 MED ORDER — LISINOPRIL-HYDROCHLOROTHIAZIDE 20-25 MG PO TABS: 1.0000 | ORAL_TABLET | Freq: Every day | ORAL | 1 refills | Status: DC

## 2021-12-26 MED ORDER — METOPROLOL TARTRATE 50 MG PO TABS: 25.0000 mg | ORAL_TABLET | Freq: Two times a day (BID) | ORAL | 0 refills | Status: DC

## 2021-12-26 MED ORDER — AMLODIPINE BESYLATE 10 MG PO TABS: 10.0000 mg | ORAL_TABLET | Freq: Every day | ORAL | 1 refills | Status: DC

## 2021-12-26 MED ORDER — AMLODIPINE BESYLATE 10 MG PO TABS: 10.0000 mg | ORAL_TABLET | Freq: Every day | ORAL | 0 refills | Status: DC

## 2021-12-26 NOTE — Telephone Encounter (Signed)
Attempted to reach Alfred Avila to get him scheduled for an appointment to come in the office.  Only 30 BP meds. ? ? ? ? ? ? ? ? ? ? ? ? ? ? ? ?

## 2022-02-20 ENCOUNTER — Encounter (INDEPENDENT_AMBULATORY_CARE_PROVIDER_SITE_OTHER): Payer: Self-pay | Admitting: Primary Care

## 2022-02-20 ENCOUNTER — Ambulatory Visit (INDEPENDENT_AMBULATORY_CARE_PROVIDER_SITE_OTHER): Payer: Self-pay | Admitting: Primary Care

## 2022-02-20 VITALS — BP 119/84 | HR 62 | Temp 97.4°F | Ht 78.0 in | Wt 307.2 lb

## 2022-02-20 DIAGNOSIS — F4321 Adjustment disorder with depressed mood: Secondary | ICD-10-CM

## 2022-02-20 DIAGNOSIS — E782 Mixed hyperlipidemia: Secondary | ICD-10-CM

## 2022-02-20 DIAGNOSIS — I10 Essential (primary) hypertension: Secondary | ICD-10-CM

## 2022-02-20 DIAGNOSIS — M5440 Lumbago with sciatica, unspecified side: Secondary | ICD-10-CM

## 2022-02-20 DIAGNOSIS — Z760 Encounter for issue of repeat prescription: Secondary | ICD-10-CM

## 2022-02-20 MED ORDER — AMLODIPINE BESYLATE 10 MG PO TABS: 10.0000 mg | ORAL_TABLET | Freq: Every day | ORAL | 1 refills | Status: DC

## 2022-02-20 MED ORDER — CYCLOBENZAPRINE HCL 10 MG PO TABS: 10.0000 mg | ORAL_TABLET | Freq: Three times a day (TID) | ORAL | 1 refills | Status: AC | PRN

## 2022-02-20 MED ORDER — METOPROLOL TARTRATE 50 MG PO TABS: 25.0000 mg | ORAL_TABLET | Freq: Two times a day (BID) | ORAL | 1 refills | Status: DC

## 2022-02-20 MED ORDER — ALBUTEROL SULFATE HFA 108 (90 BASE) MCG/ACT IN AERS: 2.0000 | INHALATION_SPRAY | Freq: Four times a day (QID) | RESPIRATORY_TRACT | 1 refills | Status: DC | PRN

## 2022-02-20 NOTE — Progress Notes (Signed)
Would like Rx for cyclobenzaprine

## 2022-02-20 NOTE — Patient Instructions (Signed)

## 2022-02-20 NOTE — Progress Notes (Signed)
Acute Office Visit  Subjective:     Patient ID: Alfred Avila, male    DOB: 1985-12-30, 36 y.o.   MRN: [id number]  Chief Complaint  Patient presents with   Hypertension    HPI Alfred Avila is a 36 year old obese male who has been recently having more flareups with his sciatica causing increased pain.Pain level 6/10.  Cyclobenzaprine does take the edge off but does not completely resolve the pain.  Unfortunately, he is unable to see orthopedist for treatment due to his financial status at this time.  Will encourage him to apply for disability and if denied he can apply for [facility] financial.  At that time we can review the problem and proceed with trying to get some type of resolution.  Aggravating factor after our sitting for a period of time, walking, trying to go up stairs, bending and trying to find a position for rest or sleep.  Alleviating factors cyclobenzaprine but does not resolve the problem and standing for short period of time.  He is also following up on management of his hypertension Denies shortness of breath, headaches, chest pain or lower extremity edema.  Blood pressure is unremarkable and requesting refills.      Objective:    BP 119/84   Pulse 62   Temp (!) 97.4 F (36.3 C) (Oral)   Ht 6\' 6"  (1.981 m)   Wt (!) 307 lb 3.2 oz (139.3 kg)   SpO2 96%   BMI 35.50 kg/m  BP Readings from Last 3 Encounters:  02/20/22 119/84  08/22/21 101/69  05/19/21 130/89   Physical exam: General: Vital signs reviewed.  Patient is well-developed and well-nourished,obese male  in no acute distress and cooperative with exam. Head: Normocephalic and atraumatic. Eyes: EOMI, conjunctivae normal, no scleral icterus. Neck: Supple, trachea midline, normal ROM, no JVD, masses, thyromegaly, or carotid bruit present. Cardiovascular: RRR, S1 normal, S2 normal, no murmurs, gallops, or rubs. Pulmonary/Chest: Clear to auscultation bilaterally, no wheezes, rales, or rhonchi. Abdominal: Soft,  non-tender, non-distended, BS +, no masses, organomegaly, or guarding present. Musculoskeletal:  Back stiffness and pain .  Upper  full  ROM and nontender. Extremities: No lower extremity edema bilaterally,  pulses symmetric and intact bilaterally. No cyanosis or clubbing. Neurological: A&O x3, Strength is normal Skin: Warm, dry and intact. No rashes or erythema. Psychiatric: Normal mood and affect. speech and behavior is normal. Cognition and memory are normal.     No results found for any visits on 02/20/22.      Assessment & Plan:  Alfred Avila was seen today for hypertension.  Diagnoses and all orders for this visit:  Essential hypertension Blood pressure is at  goal of less than 130/80, low-sodium, DASH diet, medication compliance, 150 minutes of moderate intensity exercise per week. Discussed medication compliance, adverse effects.  -     metoprolol tartrate (LOPRESSOR) 50 MG tablet; Take 0.5 tablets (25 mg total) by mouth 2 (two) times daily. -     amLODipine (NORVASC) 10 MG tablet; Take 1 tablet (10 mg total) by mouth daily.  Adjustment disorder with depressed mood Flowsheet Row Office Visit from 02/20/2022 in [facility]  PHQ-9 Total Score 15      Refuses medication and therapy   Mixed hyperlipidemia To reduce your Cholesterol , Remember - more fruits and vegetables, more fish, and limit red meat and dairy products.  More soy, nuts, beans, barley, lentils, oats and plant sterol ester enriched margarine instead of butter.  I  also encourage eliminating sugar and processed food.  Low back pain with sciatica, sciatica laterality unspecified, unspecified back pain laterality, unspecified chronicity Aggravating factors: certain movements and prolonged walking/standing. Alleviating factors: rest. Progressive LE weakness or saddle anesthesia: none. Extremity sensation changes or weakness: none. Ambulatory without difficulty. Normal bowel/bladder habits: yes; without  urinary retention. Normal PO intake without n/v. No associated abdominal pain/n/v. Self treatment: has OTC analgesics, with minimal relief   Medication refill -     metoprolol tartrate (LOPRESSOR) 50 MG tablet; Take 0.5 tablets (25 mg total) by mouth 2 (two) times daily. -     amLODipine (NORVASC) 10 MG tablet; Take 1 tablet (10 mg total) by mouth daily.  Other orders -     cyclobenzaprine (FLEXERIL) 10 MG tablet; Take 1 tablet (10 mg total) by mouth 3 (three) times daily as needed for muscle spasms. -     albuterol (VENTOLIN HFA) 108 (90 Base) MCG/ACT inhaler; Inhale 2 puffs into the lungs every 6 (six) hours as needed for wheezing or shortness of breath.    Meds ordered this encounter  Medications   cyclobenzaprine (FLEXERIL) 10 MG tablet    Sig: Take 1 tablet (10 mg total) by mouth 3 (three) times daily as needed for muscle spasms.    Dispense:  90 tablet    Refill:  1   metoprolol tartrate (LOPRESSOR) 50 MG tablet    Sig: Take 0.5 tablets (25 mg total) by mouth 2 (two) times daily.    Dispense:  90 tablet    Refill:  1   amLODipine (NORVASC) 10 MG tablet    Sig: Take 1 tablet (10 mg total) by mouth daily.    Dispense:  90 tablet    Refill:  1   albuterol (VENTOLIN HFA) 108 (90 Base) MCG/ACT inhaler    Sig: Inhale 2 puffs into the lungs every 6 (six) hours as needed for wheezing or shortness of breath.    Dispense:  6.7 g    Refill:  1    Return in about 6 months (around 08/23/2022) for BP/back pain.  Grayce Sessions, NP

## 2022-02-21 LAB — CBC WITH DIFFERENTIAL/PLATELET
Basophils Absolute: 0.1 10*3/uL (ref 0.0–0.2)
Basos: 1 %
EOS (ABSOLUTE): 0.2 10*3/uL (ref 0.0–0.4)
Eos: 3 %
Hematocrit: 54.1 % — ABNORMAL HIGH (ref 37.5–51.0)
Hemoglobin: 18.8 g/dL — ABNORMAL HIGH (ref 13.0–17.7)
Immature Grans (Abs): 0 10*3/uL (ref 0.0–0.1)
Immature Granulocytes: 0 %
Lymphocytes Absolute: 1.8 10*3/uL (ref 0.7–3.1)
Lymphs: 26 %
MCH: 30.9 pg (ref 26.6–33.0)
MCHC: 34.8 g/dL (ref 31.5–35.7)
MCV: 89 fL (ref 79–97)
Monocytes Absolute: 0.6 10*3/uL (ref 0.1–0.9)
Monocytes: 8 %
Neutrophils Absolute: 4.4 10*3/uL (ref 1.4–7.0)
Neutrophils: 62 %
Platelets: 331 10*3/uL (ref 150–450)
RBC: 6.09 x10E6/uL — ABNORMAL HIGH (ref 4.14–5.80)
RDW: 13.3 % (ref 11.6–15.4)
WBC: 7.1 10*3/uL (ref 3.4–10.8)

## 2022-02-21 LAB — CMP14+EGFR
ALT: 45 IU/L — ABNORMAL HIGH (ref 0–44)
AST: 42 IU/L — ABNORMAL HIGH (ref 0–40)
Albumin/Globulin Ratio: 1.8 (ref 1.2–2.2)
Albumin: 5 g/dL (ref 4.0–5.0)
Alkaline Phosphatase: 86 IU/L (ref 44–121)
BUN/Creatinine Ratio: 15 (ref 9–20)
BUN: 20 mg/dL (ref 6–20)
Bilirubin Total: 0.8 mg/dL (ref 0.0–1.2)
CO2: 25 mmol/L (ref 20–29)
Calcium: 10.2 mg/dL (ref 8.7–10.2)
Chloride: 96 mmol/L (ref 96–106)
Creatinine, Ser: 1.34 mg/dL — ABNORMAL HIGH (ref 0.76–1.27)
Globulin, Total: 2.8 g/dL (ref 1.5–4.5)
Glucose: 120 mg/dL — ABNORMAL HIGH (ref 70–99)
Potassium: 4.7 mmol/L (ref 3.5–5.2)
Sodium: 138 mmol/L (ref 134–144)
Total Protein: 7.8 g/dL (ref 6.0–8.5)
eGFR: 71 mL/min/{1.73_m2} (ref >59)

## 2022-02-21 LAB — LIPID PANEL
Chol/HDL Ratio: 4.6 ratio (ref 0.0–5.0)
Cholesterol, Total: 196 mg/dL (ref 100–199)
HDL: 43 mg/dL (ref >39)
LDL Chol Calc (NIH): 125 mg/dL — ABNORMAL HIGH (ref 0–99)
Triglycerides: 155 mg/dL — ABNORMAL HIGH (ref 0–149)
VLDL Cholesterol Cal: 28 mg/dL (ref 5–40)

## 2022-03-27 ENCOUNTER — Other Ambulatory Visit (INDEPENDENT_AMBULATORY_CARE_PROVIDER_SITE_OTHER): Payer: Self-pay | Admitting: Primary Care

## 2022-03-27 DIAGNOSIS — I10 Essential (primary) hypertension: Secondary | ICD-10-CM

## 2022-03-27 DIAGNOSIS — Z760 Encounter for issue of repeat prescription: Secondary | ICD-10-CM

## 2022-08-10 ENCOUNTER — Other Ambulatory Visit (INDEPENDENT_AMBULATORY_CARE_PROVIDER_SITE_OTHER): Payer: Self-pay | Admitting: Primary Care

## 2022-08-10 DIAGNOSIS — Z760 Encounter for issue of repeat prescription: Secondary | ICD-10-CM

## 2022-08-10 DIAGNOSIS — I10 Essential (primary) hypertension: Secondary | ICD-10-CM

## 2022-08-10 NOTE — Telephone Encounter (Signed)
Requested Prescriptions  Pending Prescriptions Disp Refills   metoprolol tartrate (LOPRESSOR) 50 MG tablet [Pharmacy Med Name: Metoprolol Tartrate 50 MG Oral Tablet] 90 tablet 0    Sig: Take 1/2 (one-half) tablet by mouth twice daily     Cardiovascular:  Beta Blockers Passed - 08/10/2022  9:42 AM      Passed - Last BP in normal range    BP Readings from Last 1 Encounters:  02/20/22 119/84         Passed - Last Heart Rate in normal range    Pulse Readings from Last 1 Encounters:  02/20/22 62         Passed - Valid encounter within last 6 months    Recent Outpatient Visits           5 months ago Essential hypertension   [facility] Grayce Sessions, NP   11 months ago Essential hypertension   [facility] Grayce Sessions, NP   1 year ago Essential hypertension   [facility] Ivonne Andrew, NP   1 year ago Essential hypertension   [facility] Grayce Sessions, NP   2 years ago Healthcare maintenance   [facility] Grayce Sessions, NP       Future Appointments             In 1 week Randa Evens Kinnie Scales, NP [facility]

## 2022-08-23 ENCOUNTER — Ambulatory Visit (INDEPENDENT_AMBULATORY_CARE_PROVIDER_SITE_OTHER): Payer: Self-pay

## 2022-08-23 ENCOUNTER — Ambulatory Visit (INDEPENDENT_AMBULATORY_CARE_PROVIDER_SITE_OTHER): Payer: Self-pay | Admitting: Primary Care

## 2022-08-23 VITALS — BP 124/83

## 2022-08-23 DIAGNOSIS — T63301A Toxic effect of unspecified spider venom, accidental (unintentional), initial encounter: Secondary | ICD-10-CM

## 2022-08-23 DIAGNOSIS — I10 Essential (primary) hypertension: Secondary | ICD-10-CM

## 2022-08-23 MED ORDER — DOXYCYCLINE HYCLATE 100 MG PO TABS: 100.0000 mg | ORAL_TABLET | Freq: Two times a day (BID) | ORAL | 0 refills | Status: DC

## 2022-08-23 NOTE — Addendum Note (Signed)
Addended by: Gwinda Passe on: 08/23/2022 02:38 PM   Modules accepted: Orders

## 2022-08-23 NOTE — Addendum Note (Signed)
Addended by: Gwinda Passe on: 08/23/2022 02:52 PM   Modules accepted: Orders

## 2022-08-23 NOTE — Progress Notes (Signed)
[facility]  Alfred Avila, is a 36 y.o. male  DDU:[id number]  [id number]  DOB - [birth year]  No chief complaint on file.      Subjective:   Alfred Avila is a 36 y.o. male here today for a follow up visit Bp. Patient has No headache, No chest pain, No abdominal pain - No Nausea, No new weakness tingling or numbness, No Cough - shortness of breath.  Patient mentioned being bit by a brown widow spider.  Contacted ID she reviewed the picture and suggested placing him on doxycycline for 14 days and return to reevaluate wound.  Make sure patient is aware to present to emergency room if swelling red lines go up or down leg blackened area blacken area .   No problems updated.  Allergies  Allergen Reactions   Tetanus Toxoids Anaphylaxis    Past Medical History:  Diagnosis Date   Chronic back pain    Essential hypertension    Gastric ulcer    History of blood transfusion    Migraines     Current Outpatient Medications on File Prior to Visit  Medication Sig Dispense Refill   acetaminophen (TYLENOL) 500 MG tablet Take 1,000 mg by mouth every 8 (eight) hours as needed for moderate pain.      albuterol (VENTOLIN HFA) 108 (90 Base) MCG/ACT inhaler Inhale 2 puffs into the lungs every 6 (six) hours as needed for wheezing or shortness of breath. 6.7 g 1   amLODipine (NORVASC) 10 MG tablet Take 1 tablet (10 mg total) by mouth daily. 90 tablet 1   cyclobenzaprine (FLEXERIL) 10 MG tablet Take 1 tablet (10 mg total) by mouth 3 (three) times daily as needed for muscle spasms. 90 tablet 1   lisinopril-hydrochlorothiazide (ZESTORETIC) 20-25 MG tablet Take 1 tablet by mouth once daily 90 tablet 1   metoprolol tartrate (LOPRESSOR) 50 MG tablet Take 1/2 (one-half) tablet by mouth twice daily 90 tablet 0   Multiple Vitamin (MULTIVITAMIN WITH MINERALS) TABS tablet Take 1 tablet by mouth daily. 90 tablet 3   No current facility-administered medications on file prior to visit.     Objective:   Vitals:   08/23/22 1420  BP: 124/83  Comprehensive ROS Pertinent positive and negative noted in HPI    Exam General appearance : Awake, alert, not in any distress. Speech Clear. Not toxic looking HEENT: Atraumatic and Normocephalic, pupils equally reactive to light and accomodation Neck: Supple, no JVD. No cervical lymphadenopathy.  Chest: Good air entry bilaterally, no added sounds  CVS: S1 S2 regular, no murmurs.  Abdomen: Bowel sounds present, Non tender and not distended with no gaurding, rigidity or rebound. Extremities: Wound on right leg Neurology: Awake alert, and oriented X 3, CN II-XII intact, Non focal Skin: No Rash  Data Review No results found for: "HGBA1C"  Assessment & Plan  Diagnoses and all orders for this visit:  Essential hypertension BP goal - < 130/80 Explained that having normal blood pressure is the goal and medications are helping to get to goal and maintain normal blood pressure. DIET: Limit salt intake, read nutrition labels to check salt content, limit fried and high fatty foods  Avoid using multisymptom OTC cold preparations that generally contain sudafed which can rise BP. Consult with pharmacist on best cold relief products to use for persons with HTN EXERCISE Discussed incorporating exercise such as walking - 30 minutes most days of the week and can do in 10 minute intervals     Allergic reaction to  spider bite, accidental or unintentional, initial encounter -     doxycycline (VIBRA-TABS) 100 MG tablet; Take 1 tablet (100 mg total) by mouth 2 (two) times daily.     Patient have been counseled extensively about nutrition and exercise. Other issues discussed during this visit include: low cholesterol diet, weight control and daily exercise, foot care, annual eye examinations at Ophthalmology, importance of adherence with medications and regular follow-up. We also discussed long term complications of uncontrolled diabetes and  hypertension.   2 weeks to reevaluate  The patient was given clear instructions to go to ER or return to medical center if symptoms don't improve, worsen or new problems develop. The patient verbalized understanding. The patient was told to call to get lab results if they haven't heard anything in the next week.   This note has been created with Education officer, environmental. Any transcriptional errors are unintentional.   Grayce Sessions, NP 08/27/2022, 5:59 PM

## 2022-09-06 ENCOUNTER — Encounter (INDEPENDENT_AMBULATORY_CARE_PROVIDER_SITE_OTHER): Payer: Self-pay | Admitting: Primary Care

## 2022-09-06 ENCOUNTER — Ambulatory Visit (INDEPENDENT_AMBULATORY_CARE_PROVIDER_SITE_OTHER): Payer: Self-pay | Admitting: Primary Care

## 2022-09-06 VITALS — BP 133/88 | HR 63 | Resp 16

## 2022-09-06 DIAGNOSIS — T63301A Toxic effect of unspecified spider venom, accidental (unintentional), initial encounter: Secondary | ICD-10-CM

## 2022-09-06 DIAGNOSIS — T63301S Toxic effect of unspecified spider venom, accidental (unintentional), sequela: Secondary | ICD-10-CM

## 2022-09-06 NOTE — Progress Notes (Signed)
[facility]  Alfred Avila, is a 36 y.o. male  HGD:[id number]  DQQ:[id number]  DOB - [birth year]  Chief Complaint  Patient presents with   Wound Check       Subjective:   Alfred Avila is a 36 y.o. male here today for a follow up visit. Previously consulted with Dr. Drue Second place on Doxycline for 14 day recheck wound after completion of antibiotics. Patient has No headache, No chest pain, No abdominal pain - No Nausea, No new weakness tingling or numbness, No Cough - shortness of breath  No problems updated.  Allergies  Allergen Reactions   Tetanus Toxoids Anaphylaxis    Past Medical History:  Diagnosis Date   Chronic back pain    Essential hypertension    Gastric ulcer    History of blood transfusion    Migraines     Current Outpatient Medications on File Prior to Visit  Medication Sig Dispense Refill   acetaminophen (TYLENOL) 500 MG tablet Take 1,000 mg by mouth every 8 (eight) hours as needed for moderate pain.      albuterol (VENTOLIN HFA) 108 (90 Base) MCG/ACT inhaler Inhale 2 puffs into the lungs every 6 (six) hours as needed for wheezing or shortness of breath. 6.7 g 1   amLODipine (NORVASC) 10 MG tablet Take 1 tablet (10 mg total) by mouth daily. 90 tablet 1   cyclobenzaprine (FLEXERIL) 10 MG tablet Take 1 tablet (10 mg total) by mouth 3 (three) times daily as needed for muscle spasms. 90 tablet 1   doxycycline (VIBRA-TABS) 100 MG tablet Take 1 tablet (100 mg total) by mouth 2 (two) times daily. 28 tablet 0   lisinopril-hydrochlorothiazide (ZESTORETIC) 20-25 MG tablet Take 1 tablet by mouth once daily 90 tablet 1   metoprolol tartrate (LOPRESSOR) 50 MG tablet Take 1/2 (one-half) tablet by mouth twice daily 90 tablet 0   Multiple Vitamin (MULTIVITAMIN WITH MINERALS) TABS tablet Take 1 tablet by mouth daily. 90 tablet 3   No current facility-administered medications on file prior to visit.    Objective:   Vitals:   09/06/22 1553  BP: 133/88   Pulse: 63  Resp: 16  SpO2: 100%    Exam General appearance : Awake, alert, not in any distress. Speech Clear. Not toxic looking HEENT: Atraumatic and Normocephalic, pupils equally reactive to light and accomodation Neck: Supple, no JVD. No cervical lymphadenopathy.  Chest: Good air entry bilaterally, no added sounds  CVS: S1 S2 regular, no murmurs.  Abdomen: Bowel sounds present, Non tender and not distended with no gaurding, rigidity or rebound. Extremities: B/L Lower Ext shows no edema, both legs are warm to touch Neurology: Awake alert, and oriented X 3, CN II-XII intact, Non focal Skin:wound right leg  Data Review No results found for: "HGBA1C"  Assessment & Plan  Diagnoses and all orders for this visit - Spider bite allergy, current reaction, accidental or unintentional, sequela   - Ambulatory referral to Infectious Disease    Patient have been counseled extensively about nutrition and exercise. Other issues discussed during this visit include: low cholesterol diet, weight control and daily exercise, foot care, annual eye examinations at Ophthalmology, importance of adherence with medications and regular follow-up. We also discussed long term complications of uncontrolled diabetes and hypertension.  Follow up ID referral   The patient was given clear instructions to go to ER or return to medical center if symptoms don't improve, worsen or new problems develop. The patient verbalized understanding. The patient was told to  call to get lab results if they haven't heard anything in the next week.   This note has been created with Education officer, environmental. Any transcriptional errors are unintentional.   Grayce Sessions, NP 09/06/2022, 3:53 PM

## 2022-09-11 ENCOUNTER — Other Ambulatory Visit: Payer: Self-pay

## 2022-09-11 ENCOUNTER — Encounter: Payer: Self-pay | Admitting: Internal Medicine

## 2022-09-11 ENCOUNTER — Ambulatory Visit (INDEPENDENT_AMBULATORY_CARE_PROVIDER_SITE_OTHER): Payer: Self-pay | Admitting: Internal Medicine

## 2022-09-11 VITALS — BP 133/88 | HR 66 | Temp 97.6°F | Ht 78.0 in | Wt 320.0 lb

## 2022-09-11 DIAGNOSIS — T63301A Toxic effect of unspecified spider venom, accidental (unintentional), initial encounter: Secondary | ICD-10-CM | POA: Insufficient documentation

## 2022-09-11 NOTE — Assessment & Plan Note (Signed)
Patient with small wound as sequelae of recent spider bite.  Discussed with patient that these types of bites rarely require antibiotic treatment and currently there is no signs of infection.  Recommend continued wound care and follow up PRN.

## 2022-09-11 NOTE — Progress Notes (Signed)
[facility]  Reason for Consult: Spider bite  Referring Provider: Gwinda Passe, NP   HPI:    Alfred Avila is a 36 y.o. male with PMHx as below who presents to the clinic for a spider bite.   Patient reports that he was bitten by a brown widow spider in November that resulted in a right leg wound with tissue necrosis.  His PCP prescribed doxycycline for the possibility of a secondary cellulitis which he completed.  He has been providing himself with topical wound care and reports overall the wound looks better.  There is not as much sloughing of skin from tissue necrosis as there was previously.  He is still hindered by leg stiffness and muscle spasm in the leg where he sustained the bite.  Patient's Medications  New Prescriptions   No medications on file  Previous Medications   ACETAMINOPHEN (TYLENOL) 500 MG TABLET    Take 1,000 mg by mouth every 8 (eight) hours as needed for moderate pain.    ALBUTEROL (VENTOLIN HFA) 108 (90 BASE) MCG/ACT INHALER    Inhale 2 puffs into the lungs every 6 (six) hours as needed for wheezing or shortness of breath.   AMLODIPINE (NORVASC) 10 MG TABLET    Take 1 tablet (10 mg total) by mouth daily.   CYCLOBENZAPRINE (FLEXERIL) 10 MG TABLET    Take 1 tablet (10 mg total) by mouth 3 (three) times daily as needed for muscle spasms.   FERROUS SULFATE (IRON PO)    Take by mouth.   LISINOPRIL-HYDROCHLOROTHIAZIDE (ZESTORETIC) 20-25 MG TABLET    Take 1 tablet by mouth once daily   METOPROLOL TARTRATE (LOPRESSOR) 50 MG TABLET    Take 1/2 (one-half) tablet by mouth twice daily   MULTIPLE VITAMIN (MULTIVITAMIN WITH MINERALS) TABS TABLET    Take 1 tablet by mouth daily.  Modified Medications   No medications on file  Discontinued Medications   DOXYCYCLINE (VIBRA-TABS) 100 MG TABLET    Take 1 tablet (100 mg total) by mouth 2 (two) times daily.      Past Medical History:  Diagnosis Date   Chronic back pain    Essential  hypertension    Gastric ulcer    History of blood transfusion    Migraines     Social History   Tobacco Use   Smoking status: Never   Smokeless tobacco: Never   Tobacco comments:    cannabinoid oil (CBD oil) - 3-4 times daily  Vaping Use   Vaping Use: Never used  Substance Use Topics   Alcohol use: Yes    Comment: denies use since prior "ulcer"   Drug use: No    No family history on file.  Allergies  Allergen Reactions   Tetanus Toxoids Anaphylaxis    Review of Systems  All other systems reviewed and are negative.     OBJECTIVE:    Vitals:   09/11/22 1445 09/11/22 1448  BP:  133/88  Pulse:  66  Temp:  97.6 F (36.4 C)  TempSrc:  Oral  SpO2:  98%  Weight: (!) 320 lb (145.2 kg)   Height: 6\' 6"  (1.981 m)      Body mass index is 36.98 kg/m.  Physical Exam Constitutional:      Appearance: Normal appearance.  HENT:     Head: Normocephalic and atraumatic.  Eyes:     Extraocular Movements: Extraocular movements intact.     Conjunctiva/sclera: Conjunctivae normal.  Pulmonary:  Effort: Pulmonary effort is normal. No respiratory distress.  Abdominal:     General: There is no distension.     Palpations: Abdomen is soft.  Musculoskeletal:     Comments: Lateral right leg wound with granulation tissue and no signs of infection.  Skin:    General: Skin is warm and dry.  Neurological:     General: No focal deficit present.     Mental Status: He is alert and oriented to person, place, and time.  Psychiatric:        Mood and Affect: Mood normal.        Behavior: Behavior normal.      Labs and Microbiology:     Latest Ref Rng & Units 02/20/2022   11:37 AM 11/19/2020   10:59 AM 02/25/2020    9:22 AM  CBC  WBC 3.4 - 10.8 x10E3/uL 7.1  7.5  6.7   Hemoglobin 13.0 - 17.7 g/dL 45.3  64.6  80.3   Hematocrit 37.5 - 51.0 % 54.1  54.9  50.8   Platelets 150 - 450 x10E3/uL 331  398  370       Latest Ref Rng & Units 02/20/2022   11:37 AM 02/25/2020    9:22 AM  06/28/2019    4:54 PM  CMP  Glucose 70 - 99 mg/dL 212  96  248   BUN 6 - 20 mg/dL 20  16  16    Creatinine 0.76 - 1.27 mg/dL  2.50  0.37   Sodium 134 - 144 mmol/L 138  138  139   Potassium 3.5 - 5.2 mmol/L 4.7  4.7  4.6   Chloride 96 - 106 mmol/L 96  105  103   CO2 20 - 29 mmol/L 25  25  26    Calcium 8.7 - 10.2 mg/dL 0.48  9.1  9.2   Total Protein 6.0 - 8.5 g/dL 7.8  6.9    Total Bilirubin 0.0 - 1.2 mg/dL 0.8  1.1    Alkaline Phos 44 - 121 IU/L 86  76    AST 0 - 40 IU/L 42  47    ALT 0 - 44 IU/L 45  46        ASSESSMENT & PLAN:    Spider bite Patient with small wound as sequelae of recent spider bite.  Discussed with patient that these types of bites rarely require antibiotic treatment and currently there is no signs of infection.  Recommend continued wound care and follow up PRN.     for Infectious Disease [facility] 09/11/2022, 3:04 PM

## 2022-09-22 ENCOUNTER — Other Ambulatory Visit (INDEPENDENT_AMBULATORY_CARE_PROVIDER_SITE_OTHER): Payer: Self-pay | Admitting: Primary Care

## 2022-09-22 DIAGNOSIS — I10 Essential (primary) hypertension: Secondary | ICD-10-CM

## 2022-09-22 DIAGNOSIS — Z760 Encounter for issue of repeat prescription: Secondary | ICD-10-CM

## 2022-09-22 NOTE — Telephone Encounter (Signed)
Requested Prescriptions  Pending Prescriptions Disp Refills   lisinopril-hydrochlorothiazide (ZESTORETIC) 20-25 MG tablet [Pharmacy Med Name: Lisinopril-hydroCHLOROthiazide 20-25 MG Oral Tablet] 90 tablet 0    Sig: Take 1 tablet by mouth once daily     Cardiovascular:  ACEI + Diuretic Combos Failed - 09/22/2022 11:36 AM      Failed - Na in normal range and within 180 days    Sodium  Date Value Ref Range Status  02/20/2022 138 134 - 144 mmol/L Final         Failed - K in normal range and within 180 days    Potassium  Date Value Ref Range Status  02/20/2022 4.7 3.5 - 5.2 mmol/L Final         Failed - Cr in normal range and within 180 days    Creatinine, Ser  Date Value Ref Range Status  02/20/2022 1.34 (H) 0.76 - 1.27 mg/dL Final         Failed - eGFR is 30 or above and within 180 days    GFR calc Af Amer  Date Value Ref Range Status  02/25/2020 >60 >60 mL/min Final   GFR calc non Af Amer  Date Value Ref Range Status  02/25/2020 >60 >60 mL/min Final   eGFR  Date Value Ref Range Status  02/20/2022 71 >59 mL/min/1.73 Final         Passed - Patient is not pregnant      Passed - Last BP in normal range    BP Readings from Last 1 Encounters:  09/11/22 133/88         Passed - Valid encounter within last 6 months    Recent Outpatient Visits           2 weeks ago Allergic reaction to spider bite, accidental or unintentional, subs encounter   [place], Michelle P, NP   7 months ago Essential hypertension   [place] Kerin Perna, NP   1 year ago Essential hypertension   [place] Kerin Perna, NP   1 year ago Essential hypertension   [place] Fenton Foy, NP   1 year ago Essential hypertension   [place] Kerin Perna, NP

## 2022-09-30 ENCOUNTER — Other Ambulatory Visit (INDEPENDENT_AMBULATORY_CARE_PROVIDER_SITE_OTHER): Payer: Self-pay | Admitting: Primary Care

## 2022-09-30 DIAGNOSIS — Z760 Encounter for issue of repeat prescription: Secondary | ICD-10-CM

## 2022-09-30 DIAGNOSIS — I10 Essential (primary) hypertension: Secondary | ICD-10-CM

## 2022-10-01 NOTE — Telephone Encounter (Signed)
Requested Prescriptions  Pending Prescriptions Disp Refills   amLODipine (NORVASC) 10 MG tablet [Pharmacy Med Name: amLODIPine Besylate 10 MG Oral Tablet] 30 tablet 0    Sig: Take 1 tablet by mouth once daily     Cardiovascular: Calcium Channel Blockers 2 Passed - 09/30/2022  2:51 PM      Passed - Last BP in normal range    BP Readings from Last 1 Encounters:  09/11/22 133/88         Passed - Last Heart Rate in normal range    Pulse Readings from Last 1 Encounters:  09/11/22 66         Passed - Valid encounter within last 6 months    Recent Outpatient Visits           3 weeks ago Allergic reaction to spider bite, accidental or unintentional, subs encounter   [facility] Grayce Sessions, NP   7 months ago Essential hypertension   [facility] Grayce Sessions, NP   1 year ago Essential hypertension   [facility] Grayce Sessions, NP   1 year ago Essential hypertension   [facility] Ivonne Andrew, NP   1 year ago Essential hypertension   [facility] Grayce Sessions, NP

## 2022-10-19 ENCOUNTER — Other Ambulatory Visit (INDEPENDENT_AMBULATORY_CARE_PROVIDER_SITE_OTHER): Payer: Self-pay | Admitting: Primary Care

## 2022-10-19 DIAGNOSIS — Z760 Encounter for issue of repeat prescription: Secondary | ICD-10-CM

## 2022-10-19 DIAGNOSIS — I10 Essential (primary) hypertension: Secondary | ICD-10-CM

## 2022-10-19 MED ORDER — LISINOPRIL-HYDROCHLOROTHIAZIDE 20-25 MG PO TABS: 1.0000 | ORAL_TABLET | Freq: Every day | ORAL | 0 refills | Status: DC

## 2022-10-19 MED ORDER — AMLODIPINE BESYLATE 10 MG PO TABS: 10.0000 mg | ORAL_TABLET | Freq: Every day | ORAL | 0 refills | Status: DC

## 2022-10-19 MED ORDER — METOPROLOL TARTRATE 50 MG PO TABS: ORAL_TABLET | ORAL | 0 refills | Status: DC

## 2022-10-19 NOTE — Telephone Encounter (Signed)
Requested Prescriptions  Pending Prescriptions Disp Refills   lisinopril-hydrochlorothiazide (ZESTORETIC) 20-25 MG tablet 30 tablet 0    Sig: Take 1 tablet by mouth daily.     Cardiovascular:  ACEI + Diuretic Combos Failed - 10/19/2022 10:26 AM      Failed - Na in normal range and within 180 days    Sodium  Date Value Ref Range Status  02/20/2022 138 134 - 144 mmol/L Final         Failed - K in normal range and within 180 days    Potassium  Date Value Ref Range Status  02/20/2022 4.7 3.5 - 5.2 mmol/L Final         Failed - Cr in normal range and within 180 days    Creatinine, Ser  Date Value Ref Range Status  02/20/2022 1.34 (H) 0.76 - 1.27 mg/dL Final         Failed - eGFR is 30 or above and within 180 days    GFR calc Af Amer  Date Value Ref Range Status  02/25/2020 >60 >60 mL/min Final   GFR calc non Af Amer  Date Value Ref Range Status  02/25/2020 >60 >60 mL/min Final   eGFR  Date Value Ref Range Status  02/20/2022 71 >59 mL/min/1.73 Final         Passed - Patient is not pregnant      Passed - Last BP in normal range    BP Readings from Last 1 Encounters:  09/11/22 133/88         Passed - Valid encounter within last 6 months    Recent Outpatient Visits           1 month ago Allergic reaction to spider bite, accidental or unintentional, subs encounter   [place], Michelle P, NP   8 months ago Essential hypertension   [place], [place], NP   1 year ago Essential hypertension   [place], Jay, NP   1 year ago Essential hypertension   [place] Fenton Foy, NP   1 year ago Essential hypertension   [facility] Juluis Mire P, NP               amLODipine (NORVASC) 10 MG tablet 30 tablet 0    Sig: Take 1 tablet (10 mg total) by mouth daily.     Cardiovascular: Calcium Channel Blockers  2 Passed - 10/19/2022 10:26 AM      Passed - Last BP in normal range    BP Readings from Last 1 Encounters:  09/11/22 133/88         Passed - Last Heart Rate in normal range    Pulse Readings from Last 1 Encounters:  09/11/22 66         Passed - Valid encounter within last 6 months    Recent Outpatient Visits           1 month ago Allergic reaction to spider bite, accidental or unintentional, subs encounter   [place], Michelle P, NP   8 months ago Essential hypertension   [place], [place], NP   1 year ago Essential hypertension   [place] Kerin Perna, NP   1 year ago Essential hypertension   [place] Fenton Foy, NP  1 year ago Essential hypertension   [place] Juluis Mire P, NP               metoprolol tartrate (LOPRESSOR) 50 MG tablet 30 tablet 0    Sig: Take 1/2 (one-half) tablet by mouth twice daily     Cardiovascular:  Beta Blockers Passed - 10/19/2022 10:26 AM      Passed - Last BP in normal range    BP Readings from Last 1 Encounters:  09/11/22 133/88         Passed - Last Heart Rate in normal range    Pulse Readings from Last 1 Encounters:  09/11/22 66         Passed - Valid encounter within last 6 months    Recent Outpatient Visits           1 month ago Allergic reaction to spider bite, accidental or unintentional, subs encounter   [place], Michelle P, NP   8 months ago Essential hypertension   [place], [place], NP   1 year ago Essential hypertension   [place], [place], NP   1 year ago Essential hypertension   [place] Fenton Foy, NP   1 year ago Essential hypertension   [place] Kerin Perna, NP

## 2022-10-19 NOTE — Telephone Encounter (Signed)
Medication Refill - Medication: lisinopril-hydrochlorothiazide (ZESTORETIC) 20-25 MG tablet  amLODipine (NORVASC) 10 MG tablet  metoprolol tartrate (LOPRESSOR) 50 MG tablet    Has the patient contacted their pharmacy? Yes.     Patient referred to PCP   Preferred Pharmacy (with phone number or street name):  [place] [place] Phone: [phone]  Fax: [phone]     Has the patient been seen for an appointment in the last year OR does the patient have an upcoming appointment? Yes.    Agent: Please be advised that RX refills may take up to 3 business days. We ask that you follow-up with your pharmacy.

## 2022-11-13 ENCOUNTER — Other Ambulatory Visit (INDEPENDENT_AMBULATORY_CARE_PROVIDER_SITE_OTHER): Payer: Self-pay | Admitting: Primary Care

## 2022-11-13 DIAGNOSIS — Z760 Encounter for issue of repeat prescription: Secondary | ICD-10-CM

## 2022-11-13 DIAGNOSIS — I10 Essential (primary) hypertension: Secondary | ICD-10-CM

## 2022-11-14 NOTE — Telephone Encounter (Signed)
Requested Prescriptions  Pending Prescriptions Disp Refills   metoprolol tartrate (LOPRESSOR) 50 MG tablet [Pharmacy Med Name: Metoprolol Tartrate 50 MG Oral Tablet] 90 tablet 0    Sig: Take 1/2 (one-half) tablet by mouth twice daily     Cardiovascular:  Beta Blockers Passed - 11/13/2022  9:35 AM      Passed - Last BP in normal range    BP Readings from Last 1 Encounters:  09/11/22 133/88         Passed - Last Heart Rate in normal range    Pulse Readings from Last 1 Encounters:  09/11/22 66         Passed - Valid encounter within last 6 months    Recent Outpatient Visits           2 months ago Allergic reaction to spider bite, accidental or unintentional, subs encounter   [place] [facility] Kerin Perna, NP   8 months ago Essential hypertension   [place], Michelle P, NP   1 year ago Essential hypertension   [place], Michelle P, NP   1 year ago Essential hypertension   [place], Tonya S, NP   1 year ago Essential hypertension   [place], [place], NP

## 2022-12-07 ENCOUNTER — Other Ambulatory Visit (INDEPENDENT_AMBULATORY_CARE_PROVIDER_SITE_OTHER): Payer: Self-pay | Admitting: Primary Care

## 2022-12-07 DIAGNOSIS — Z760 Encounter for issue of repeat prescription: Secondary | ICD-10-CM

## 2022-12-07 DIAGNOSIS — I10 Essential (primary) hypertension: Secondary | ICD-10-CM

## 2022-12-07 NOTE — Telephone Encounter (Signed)
Requested medication (s) are due for refill today: yes  Requested medication (s) are on the active medication list: yes  Last refill:  10/19/22  Future visit scheduled: no  Notes to clinic:  Unable to refill per protocol, courtesy refill already given, routing for provider approval.      Requested Prescriptions  Pending Prescriptions Disp Refills   amLODipine (NORVASC) 10 MG tablet [Pharmacy Med Name: amLODIPine Besylate 10 MG Oral Tablet] 30 tablet 0    Sig: TAKE 1 TABLET BY MOUTH ONCE DAILY . APPOINTMENT REQUIRED FOR FUTURE REFILLS     Cardiovascular: Calcium Channel Blockers 2 Passed - 12/07/2022 11:45 AM      Passed - Last BP in normal range    BP Readings from Last 1 Encounters:  09/11/22 133/88         Passed - Last Heart Rate in normal range    Pulse Readings from Last 1 Encounters:  09/11/22 66         Passed - Valid encounter within last 6 months    Recent Outpatient Visits           3 months ago Allergic reaction to spider bite, accidental or unintentional, subs encounter   [place] [facility] Kerin Perna, NP   9 months ago Essential hypertension   [place], Michelle P, NP   1 year ago Essential hypertension   [place], Michelle P, NP   1 year ago Essential hypertension   [place], Tonya S, NP   2 years ago Essential hypertension   [place], Michelle P, NP

## 2022-12-24 ENCOUNTER — Other Ambulatory Visit (INDEPENDENT_AMBULATORY_CARE_PROVIDER_SITE_OTHER): Payer: Self-pay | Admitting: Primary Care

## 2022-12-24 DIAGNOSIS — I10 Essential (primary) hypertension: Secondary | ICD-10-CM

## 2022-12-24 DIAGNOSIS — Z760 Encounter for issue of repeat prescription: Secondary | ICD-10-CM

## 2022-12-25 NOTE — Telephone Encounter (Signed)
Will forward to provider  

## 2023-02-07 ENCOUNTER — Other Ambulatory Visit (INDEPENDENT_AMBULATORY_CARE_PROVIDER_SITE_OTHER): Payer: Self-pay

## 2023-02-07 MED ORDER — ALBUTEROL SULFATE HFA 108 (90 BASE) MCG/ACT IN AERS: 2.0000 | INHALATION_SPRAY | Freq: Four times a day (QID) | RESPIRATORY_TRACT | 1 refills | Status: DC | PRN

## 2023-03-12 ENCOUNTER — Telehealth (INDEPENDENT_AMBULATORY_CARE_PROVIDER_SITE_OTHER): Payer: Self-pay

## 2023-03-12 NOTE — Transitions of Care (Post Inpatient/ED Visit) (Unsigned)
   03/12/2023  Name: Alfred Avila MRN: [id number] DOB: 22-Jul-1986  Today's TOC FU Call Status: Today's TOC FU Call Status:: Unsuccessul Call (1st Attempt) Unsuccessful Call (1st Attempt) Date: 03/12/23  Attempted to reach the patient regarding the most recent Inpatient/ED visit.  Follow Up Plan: Additional outreach attempts will be made to reach the patient to complete the Transitions of Care (Post Inpatient/ED visit) call.   Signature   Woodfin Ganja LPN [facility] Nurse Health Advisor Direct Dial [phone]

## 2023-03-13 NOTE — Transitions of Care (Post Inpatient/ED Visit) (Unsigned)
   03/13/2023  Name: Alfred Avila MRN: [id number] DOB: 1985/11/05  Today's TOC FU Call Status: Today's TOC FU Call Status:: Unsuccessful Call (2nd Attempt) Unsuccessful Call (1st Attempt) Date: 03/12/23 Unsuccessful Call (2nd Attempt) Date: 03/13/23  Attempted to reach the patient regarding the most recent Inpatient/ED visit.  Follow Up Plan: Additional outreach attempts will be made to reach the patient to complete the Transitions of Care (Post Inpatient/ED visit) call.   Signature   Woodfin Ganja LPN [facility] Nurse Health Advisor Direct Dial [phone]

## 2023-03-14 NOTE — Transitions of Care (Post Inpatient/ED Visit) (Signed)
   03/14/2023  Name: Alfred Avila MRN: [id number] DOB: 1986-01-21  Today's TOC FU Call Status: Today's TOC FU Call Status:: Successful TOC FU Call Competed Unsuccessful Call (1st Attempt) Date: 03/12/23 Unsuccessful Call (2nd Attempt) Date: 03/13/23 Unsuccessful Call (3rd Attempt) Date: 03/14/23 [facility] FU Call Complete Date: 03/14/23  Attempted to reach the patient regarding the most recent Inpatient/ED visit.  Follow Up Plan: No further outreach attempts will be made at this time. We have been unable to contact the patient.  Signature   Woodfin Ganja LPN [facility] Nurse Health Advisor Direct Dial [phone]

## 2023-04-05 ENCOUNTER — Other Ambulatory Visit (INDEPENDENT_AMBULATORY_CARE_PROVIDER_SITE_OTHER): Payer: Self-pay | Admitting: Primary Care
